# Patient Record
Sex: Male | Born: 1970 | Hispanic: Yes | Marital: Married | Smoking: Current some day smoker
Health system: Southern US, Community
[De-identification: ages and names within clinical notes are randomized; demographics above are authoritative.]

## PROBLEM LIST (undated history)

## (undated) DIAGNOSIS — K5792 Diverticulitis of intestine, part unspecified, without perforation or abscess without bleeding: Secondary | ICD-10-CM

## (undated) HISTORY — DX: Diverticulitis of intestine, part unspecified, without perforation or abscess without bleeding: K57.92

---

## 2005-03-13 ENCOUNTER — Ambulatory Visit: Payer: Self-pay | Admitting: Family Medicine

## 2006-11-03 IMAGING — CR DG ABDOMEN 3V
1 series · 3 of 3 positions shown · non-contrast
Comparison: none

REASON FOR EXAM: LEFT lower quadrant pain. (TELEPHONE RESULTS TO PHYSICIAN)
COMMENTS:

[Series 1: view not recorded · 0.17mm/px · 3 of 3 slices shown]
[im 1/3]
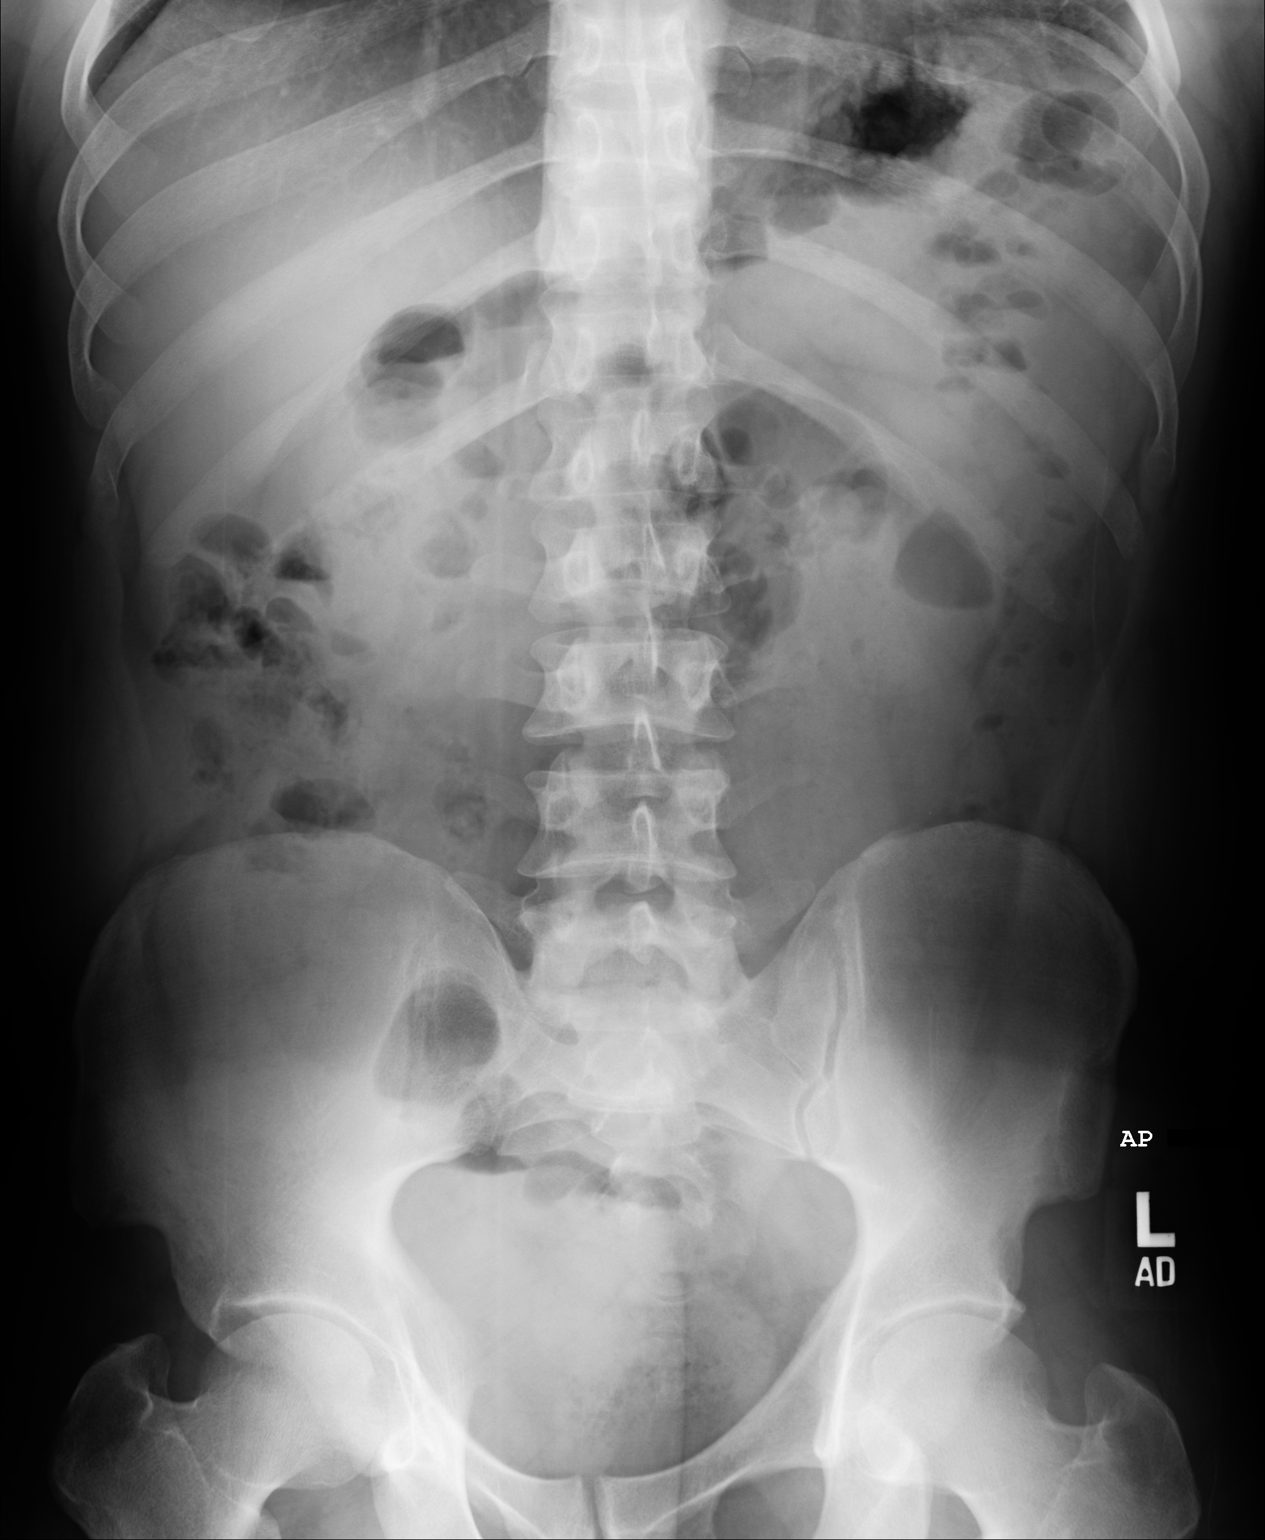
[im 2/3]
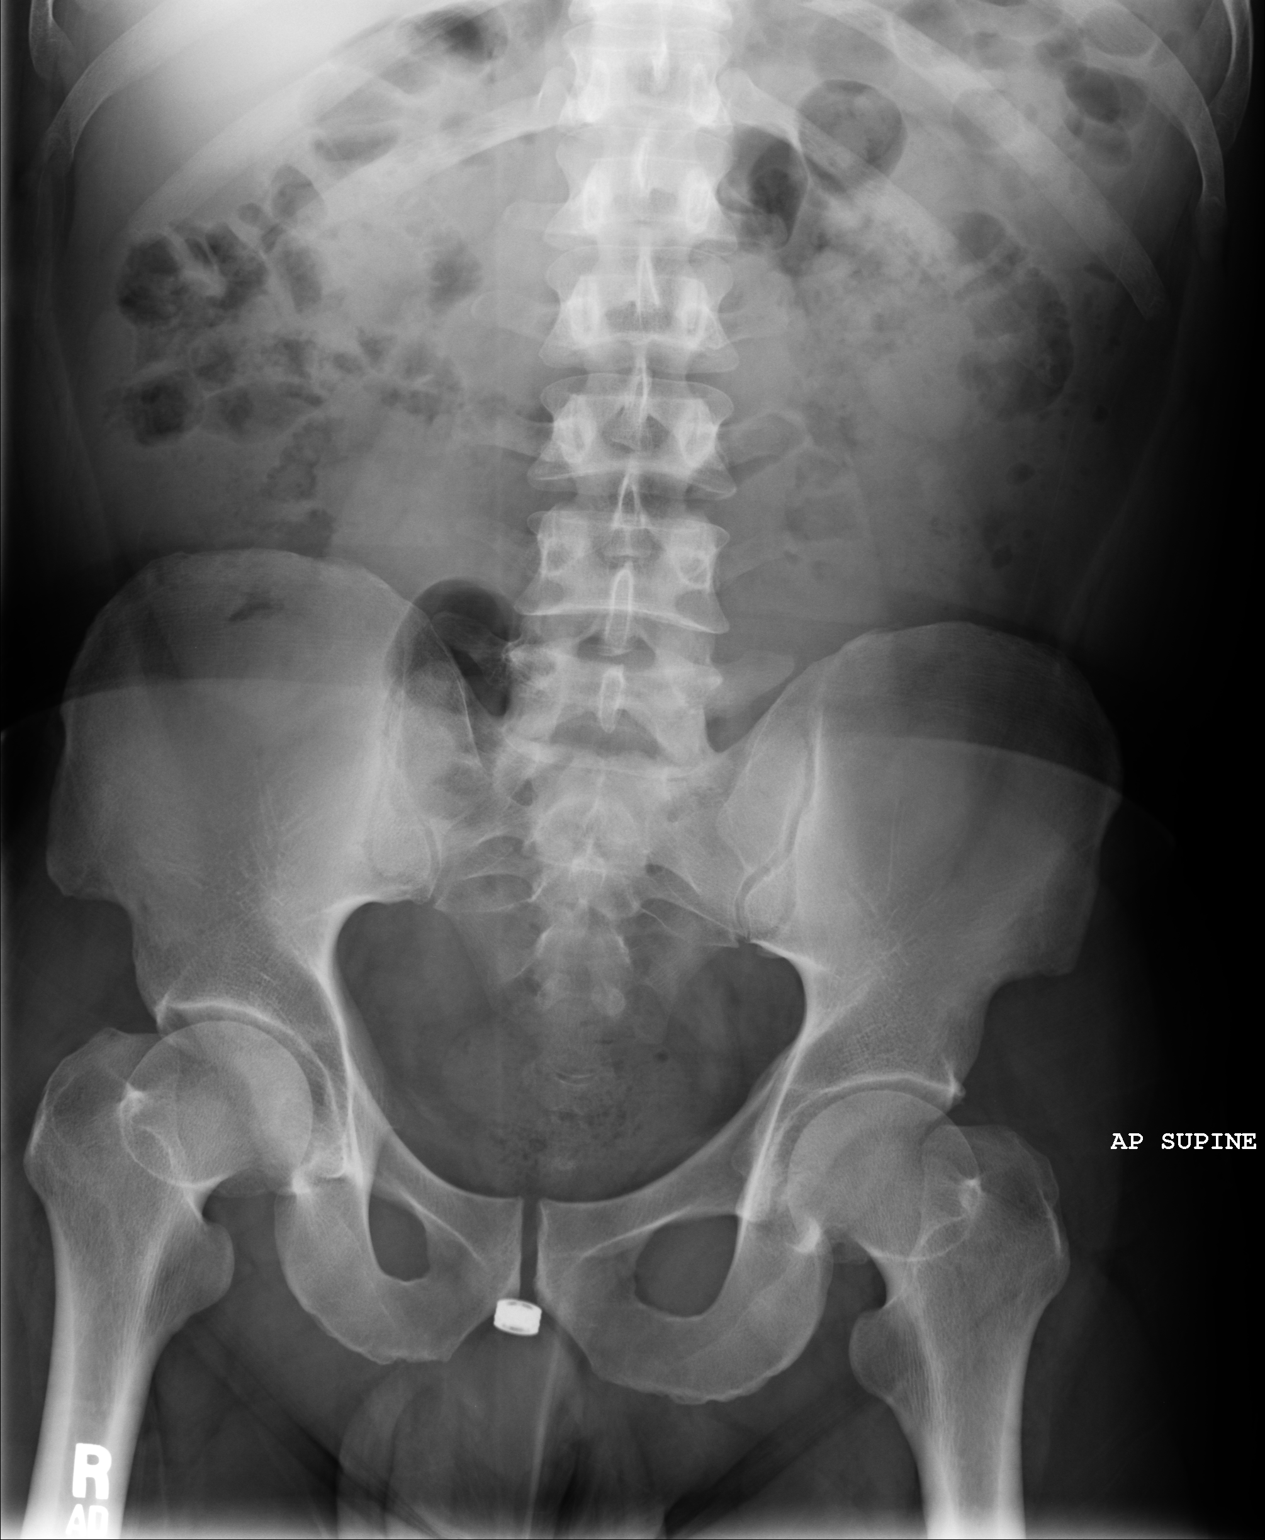
[im 3/3]
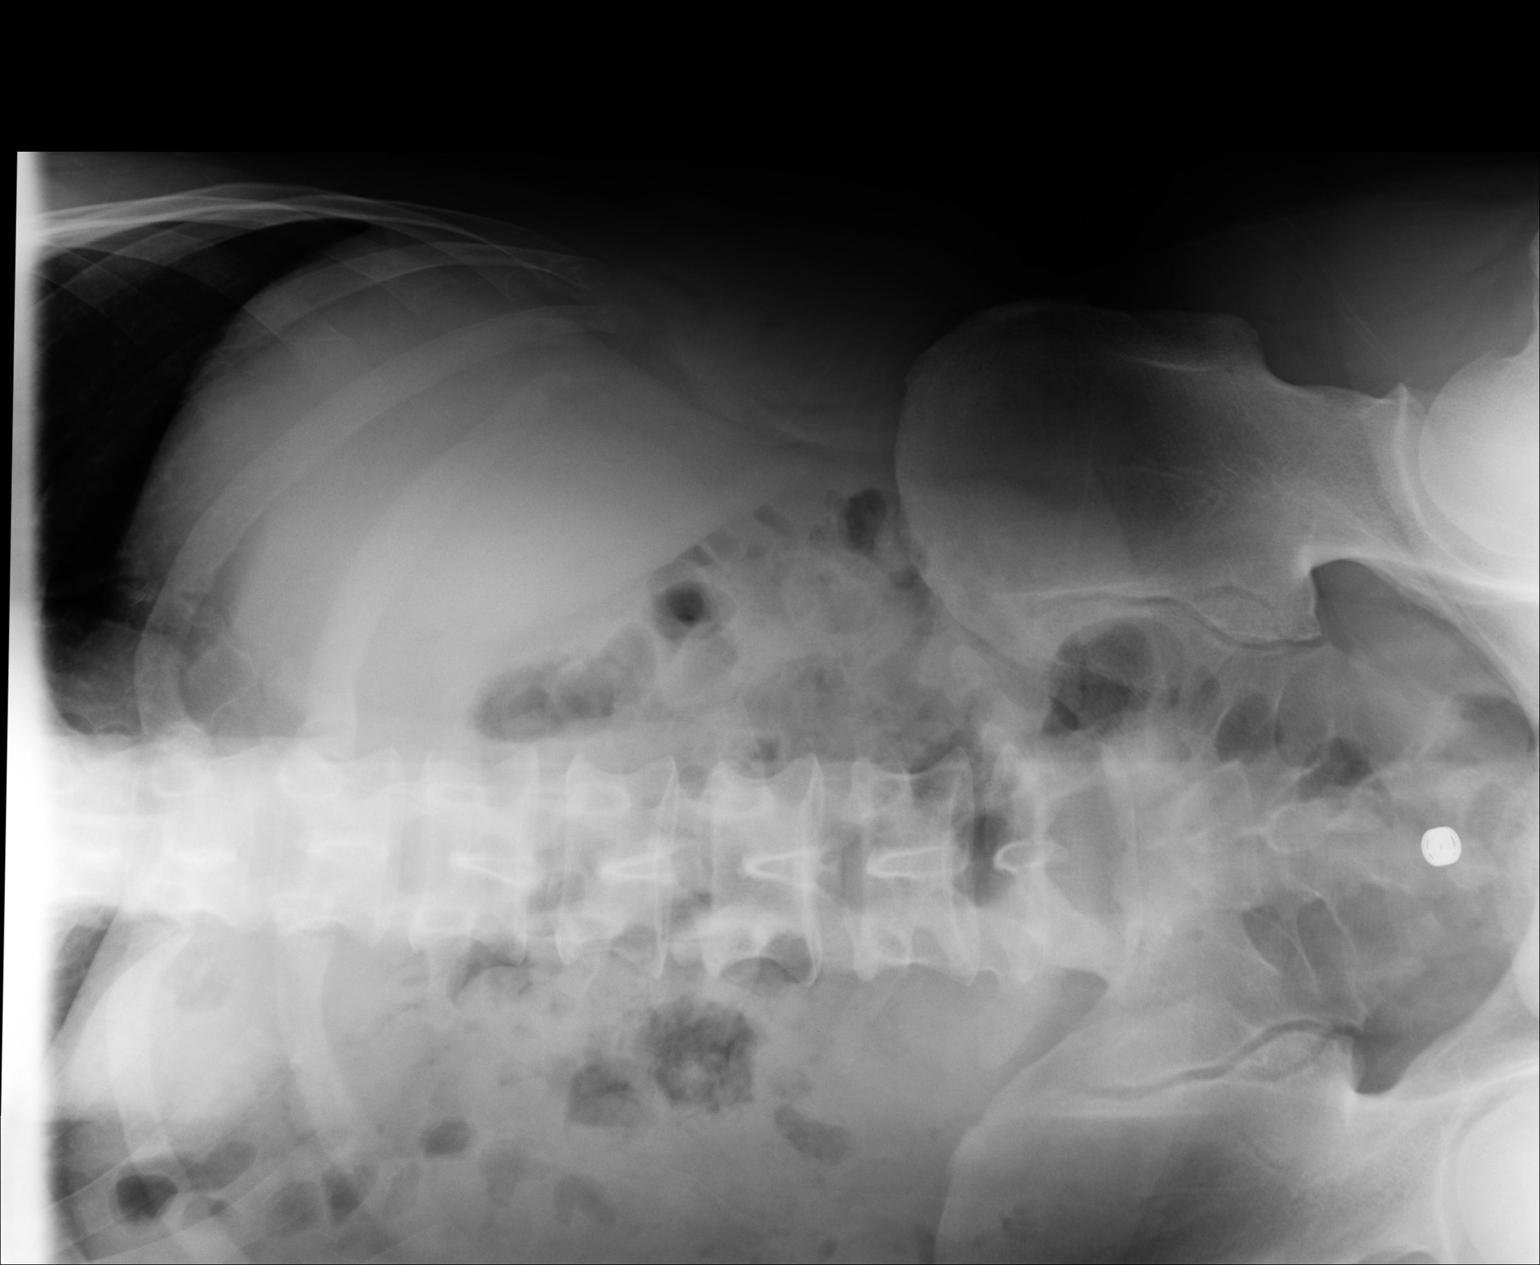

[3 of 3 positions shown; findings below may reference images not displayed]

PROCEDURE:     DXR - DXR ABDOMEN COMPLETE  - March 13, 2005 [DATE]

RESULT:     The patient is complaining of abdominal discomfort.

The bowel gas pattern is relatively nonspecific.  There may be an element of
constipation present.  There is no evidence of obstruction or ileus. The
bony structures are normal in appearance and I see no evidence of soft
tissue calcification.
IMPRESSION: I see no acute intraabdominal abnormality.  Further evaluation with CT
scanning may be of value.  The bowel gas pattern may indicate constipation.

## 2022-10-24 ENCOUNTER — Emergency Department: Payer: Self-pay

## 2022-10-24 ENCOUNTER — Other Ambulatory Visit: Payer: Self-pay

## 2022-10-24 ENCOUNTER — Emergency Department
Admission: EM | Admit: 2022-10-24 | Discharge: 2022-10-25 | Disposition: A | Discharge status: Home / Self Care | Payer: Self-pay | Attending: Emergency Medicine | Admitting: Emergency Medicine

## 2022-10-24 DIAGNOSIS — K5792 Diverticulitis of intestine, part unspecified, without perforation or abscess without bleeding: Secondary | ICD-10-CM

## 2022-10-24 DIAGNOSIS — R109 Unspecified abdominal pain: Secondary | ICD-10-CM

## 2022-10-24 DIAGNOSIS — K5732 Diverticulitis of large intestine without perforation or abscess without bleeding: Secondary | ICD-10-CM | POA: Insufficient documentation

## 2022-10-24 LAB — COMPREHENSIVE METABOLIC PANEL
ALT: 29 U/L (ref 0–44)
AST: 21 U/L (ref 15–41)
Albumin: 4.5 g/dL (ref 3.5–5.0)
Alkaline Phosphatase: 96 U/L (ref 38–126)
Anion gap: 10 (ref 5–15)
BUN: 8 mg/dL (ref 6–20)
CO2: 24 mmol/L (ref 22–32)
Calcium: 8.6 mg/dL — ABNORMAL LOW (ref 8.9–10.3)
Chloride: 106 mmol/L (ref 98–111)
Creatinine, Ser: 0.7 mg/dL (ref 0.61–1.24)
GFR, Estimated: >60 mL/min (ref >60)
Glucose, Bld: 122 mg/dL — ABNORMAL HIGH (ref 70–99)
Potassium: 3.6 mmol/L (ref 3.5–5.1)
Sodium: 140 mmol/L (ref 135–145)
Total Bilirubin: 0.6 mg/dL (ref 0.3–1.2)
Total Protein: 8 g/dL (ref 6.5–8.1)

## 2022-10-24 LAB — CBC
HCT: 41.4 % (ref 39.0–52.0)
Hemoglobin: 13.9 g/dL (ref 13.0–17.0)
MCH: 30.9 pg (ref 26.0–34.0)
MCHC: 33.6 g/dL (ref 30.0–36.0)
MCV: 92 fL (ref 80.0–100.0)
Platelets: 349 10*3/uL (ref 150–400)
RBC: 4.5 MIL/uL (ref 4.22–5.81)
RDW: 12.9 % (ref 11.5–15.5)
WBC: 8.5 10*3/uL (ref 4.0–10.5)
nRBC: 0 % (ref 0.0–0.2)

## 2022-10-24 LAB — TYPE AND SCREEN
ABO/RH(D): O POS
Antibody Screen: NEGATIVE

## 2022-10-24 MED ORDER — IOHEXOL 300 MG/ML  SOLN
100.0000 mL | Freq: Once | INTRAMUSCULAR | Status: AC | PRN
  Administered 2022-10-24: 100 mL via INTRAVENOUS

## 2022-10-24 MED ORDER — AMOXICILLIN-POT CLAVULANATE 875-125 MG PO TABS: 1.0000 | ORAL_TABLET | Freq: Two times a day (BID) | ORAL | 0 refills | Status: AC

## 2022-10-24 NOTE — Discharge Instructions (Signed)
Please seek medical attention for any high fevers, chest pain, shortness of breath, change in behavior, persistent vomiting, bloody stool or any other new or concerning symptoms.  

## 2022-10-24 NOTE — ED Triage Notes (Signed)
Pt arrives via POV w/ c/o LLQ abd pain and blood in stool. Pt reports blood in stool is chronic, today it was "stronger".

## 2022-10-24 NOTE — ED Provider Notes (Signed)
[facility] Provider Note    Event Date/Time   First MD Initiated Contact with Patient 10/24/22 1806     (approximate)   History   Blood In Stools and Abdominal Pain   HPI  Michael Mann is a 52 y.o. male who presents to the emergency department today because of concerns for abdominal pain and blood in stools.  He states he has been having issues with this abdominal pain and bloody stools for at least 1 year.  Today however the bleeding and the pain seemed to be somewhat worse.  Patient felt like he did have slight fevers a couple of days ago.  Also has had some chills.     Physical Exam   Triage Vital Signs: ED Triage Vitals  Encounter Vitals Group     BP 10/24/22 1647 (!) 133/97     Systolic BP Percentile --      Diastolic BP Percentile --      Pulse Rate 10/24/22 1647 98     Resp 10/24/22 1647 15     Temp 10/24/22 1647 (!) 97.5 F (36.4 C)     Temp Source 10/24/22 1647 Oral     SpO2 10/24/22 1647 98 %     Weight 10/24/22 1650 150 lb (68 kg)     Height 10/24/22 1650 4' 11.06" (1.5 m)     Head Circumference --      Peak Flow --      Pain Score 10/24/22 1651 10     Pain Loc --      Pain Education --      Exclude from Growth Chart --     Most recent vital signs: Vitals:   10/24/22 1647  BP: (!) 133/97  Pulse: 98  Resp: 15  Temp: (!) 97.5 F (36.4 C)  SpO2: 98%   General: Awake, alert, oriented. CV:  Good peripheral perfusion. Regular rate and rhythm. Resp:  Normal effort. Lungs clear. Abd:  No distention. Slight tenderness to palpation in the LLQ    ED Results / Procedures / Treatments   Labs (all labs ordered are listed, but only abnormal results are displayed) Labs Reviewed  COMPREHENSIVE METABOLIC PANEL - Abnormal; Notable for the following components:      Result Value   Glucose, Bld 122 (*)    Calcium 8.6 (*)    All other components within normal limits  CBC  POC OCCULT BLOOD, ED  TYPE AND SCREEN      EKG  None   RADIOLOGY I independently interpreted and visualized the CT abd/pel. My interpretation: inflammation in the left lower quadrant Radiology interpretation:  IMPRESSION:  1. Focal adhesion of the sigmoid colon to the left bladder dome and  left anterior abdominal wall likely sequela of chronic  inflammation/diverticulitis. A fistulous tract extends from the  sigmoid colon to the left transverse abdominus muscle. No drainable  fluid collection or abscess. Follow-up with colonoscopy after  resolution of acute symptoms recommended to exclude underlying mass.  2. No bowel obstruction. Normal appendix.     PROCEDURES:  Critical Care performed: No    MEDICATIONS ORDERED IN ED: Medications - No data to display   IMPRESSION / MDM / ASSESSMENT AND PLAN / ED COURSE  I reviewed the triage vital signs and the nursing notes.                              Differential diagnosis  includes, but is not limited to, diverticulitis, neoplasm, AVM, hemorrhoid  Patient's presentation is most consistent with acute presentation with potential threat to life or bodily function.   The patient is on the cardiac monitor to evaluate for evidence of arrhythmia and/or significant heart rate changes.  Patient presented to the emergency department today because of concerns for left lower quadrant abdominal pain as well as rectal bleeding.  Symptoms have been on and off for over a year.  On exam patient is tender left lower quadrant.  I did obtain a CT scan which was concerning for adhesion of the colon to the left bladder dome and left abdominal wall.  Discussed with Dr. Aleen Campi who reviewed the images.  Recommended starting antibiotics and have patient follow-up in clinic.  I discussed findings and plan with the patient.      FINAL CLINICAL IMPRESSION(S) / ED DIAGNOSES   Final diagnoses:  Abdominal pain, unspecified abdominal location  Diverticulitis      Note:  This document  was prepared using Dragon voice recognition software and may include unintentional dictation errors.    Phineas Semen, MD 10/25/22 [phone]

## 2022-11-04 ENCOUNTER — Ambulatory Visit: Payer: Self-pay | Admitting: Surgery

## 2022-11-15 ENCOUNTER — Encounter: Payer: Self-pay | Admitting: Surgery

## 2022-11-15 ENCOUNTER — Ambulatory Visit (INDEPENDENT_AMBULATORY_CARE_PROVIDER_SITE_OTHER): Payer: Self-pay | Admitting: Surgery

## 2022-11-15 VITALS — BP 104/68 | HR 73 | Temp 98.3°F | Ht 59.06 in | Wt 136.2 lb

## 2022-11-15 DIAGNOSIS — K5732 Diverticulitis of large intestine without perforation or abscess without bleeding: Secondary | ICD-10-CM

## 2022-11-15 DIAGNOSIS — Z1211 Encounter for screening for malignant neoplasm of colon: Secondary | ICD-10-CM

## 2022-11-15 MED ORDER — AMOXICILLIN-POT CLAVULANATE 875-125 MG PO TABS: 1.0000 | ORAL_TABLET | Freq: Two times a day (BID) | ORAL | 0 refills | Status: AC

## 2022-11-15 NOTE — Progress Notes (Signed)
11/15/2022  Reason for Visit:  Diverticulitis  History of Present Illness: Michael Mann is a 52 y.o. male presenting for evaluation of diverticulitis.  The patient presented to the ED on 10/24/22 with few days of left lower quadrant pain associated with blood in his stools.  He reports that this has happened in the past before and has had issues with intermittent LLQ pain for the past 9 years.  In the ED, he had a normal Hgb of 13.9, normal WBC of 8.5.  He had a CT scan of abdomen and pelvis which showed sigmoid diverticulitis with are of possible abscess vs inflammatory change that was extending to the anterior abdominal wall and also towards the left corner of the bladder.  He was discharged with a 1 week course of Augmentin.    Patient reports that after discharge, he was feeling better while taking the antibiotic.  More recently he's developed some more pain again.  He also reports having fevers last week, but denies any worsening issues recently.  Denies any nausea or vomiting and he's having bowel movements, recently without blood.  Sometimes he has to strain more for a bowel movement and feels some discomfort in the LLQ before that.  No troubles with urination.  He does report weight loss throughout this year, about 15 lbs.  There's no family history of colon cancer.  Past Medical History: History reviewed. No pertinent past medical history.   Past Surgical History: History reviewed. No pertinent surgical history.  Home Medications: Prior to Admission medications   Medication Sig Start Date End Date Taking? Authorizing Provider  amoxicillin-clavulanate (AUGMENTIN) 875-125 MG tablet Take 1 tablet by mouth 2 (two) times daily for 14 days. 11/15/22 11/29/22 Yes Angelisse Riso, Elita Quick, MD    Allergies: No Known Allergies  Social History:  reports that he has been smoking cigarettes. He has never used smokeless tobacco. He reports current alcohol use. He reports that he does not use drugs.    Family History: History reviewed. No pertinent family history.  Review of Systems: Review of Systems  Constitutional:  Positive for fever and weight loss. Negative for chills.  HENT:  Negative for hearing loss.   Respiratory:  Negative for shortness of breath.   Cardiovascular:  Negative for chest pain.  Gastrointestinal:  Positive for abdominal pain, blood in stool and constipation. Negative for nausea and vomiting.  Genitourinary:  Negative for dysuria.  Musculoskeletal:  Negative for myalgias.  Skin:  Negative for rash.  Neurological:  Negative for dizziness.  Psychiatric/Behavioral:  Negative for depression.     Physical Exam BP 104/68   Pulse 73   Temp 98.3 F (36.8 C) (Oral)   Ht 4' 11.06" (1.5 m)   Wt 136 lb 3.2 oz (61.8 kg)   SpO2 96%   BMI 27.46 kg/m  CONSTITUTIONAL: No acute distress, well nourished. HEENT:  Normocephalic, atraumatic, extraocular motion intact. NECK: Trachea is midline, and there is no jugular venous distension.  RESPIRATORY:  Lungs are clear, and breath sounds are equal bilaterally. Normal respiratory effort without pathologic use of accessory muscles. CARDIOVASCULAR: Heart is regular without murmurs, gallops, or rubs. GI: The abdomen is soft, non-distended, with mild discomfort to palpation in left lower quadrant.  There is a palpable firmness in the deep aspect of the abdominal wall where the sigmoid colon is abutting it as noted on the CT scan.  No external changes to the skin or subcutaneous tissue.   MUSCULOSKELETAL:  Normal muscle strength and tone in all  four extremities.  No peripheral edema or cyanosis. SKIN: Skin turgor is normal. There are no pathologic skin lesions.  NEUROLOGIC:  Motor and sensation is grossly normal.  Cranial nerves are grossly intact. PSYCH:  Alert and oriented to person, place and time. Affect is normal.  Laboratory Analysis: Labs from 10/24/22: Na 140, K 3.6, Cl 106, CO2 24, BUN 8, Cr 0.70.  Total bili 0.6, AST 21,  ALT 29, alkaline phosphatase 96, albumin 4.5.  WBC 8.5, hemoglobin 13.9, hematocrit 41.4, platelets 349.  Imaging: CT abdomen/pelvis on 10/24/2022: IMPRESSION: 1. Focal adhesion of the sigmoid colon to the left bladder dome and left anterior abdominal wall likely sequela of chronic inflammation/diverticulitis. A fistulous tract extends from the sigmoid colon to the left transverse abdominus muscle. No drainable fluid collection or abscess. Follow-up with colonoscopy after resolution of acute symptoms recommended to exclude underlying mass. 2. No bowel obstruction. Normal appendix.  Assessment and Plan: This is a 52 y.o. male with chronic history of diverticulitis.  - The patient reports a chronic history of about 9 years of intermittent left lower quadrant pain.  Based on CT scan findings, this is most likely to be episodes of diverticulitis which over the course of time have developed inflammatory changes towards the abdominal wall and potentially the bladder.  There is no external changes in the abdominal wall to suggest an abscess and the patient denies any urinary symptoms to suggest a fistula to the bladder.  The patient reports that initially after treatment from the emergency room, he was feeling better but now there is some pain has been increasing again.  As a precaution, I will give the patient a longer course of Augmentin for 2 weeks total.  I will then have the patient get a CT scan again of the abdomen and pelvis with contrast in 3 weeks to reevaluate the area of inflammation. - Discussed with the patient that although this may be diverticulitis, we need to rule out any underlying masses.  We will set up a referral for gastroenterology for colonoscopy. - Patient will follow-up with me in a month to reevaluate his pain and discussed results from his CT scan.  Return precautions given.  All of his questions have been answered.  I spent 45 minutes dedicated to the care of this patient on  the date of this encounter to include pre-visit review of records, face-to-face time with the patient discussing diagnosis and management, and any post-visit coordination of care.   Howie Ill, MD [facility]

## 2022-11-15 NOTE — Patient Instructions (Addendum)
Su tomografa computarizada est programada para el 13/11/2022 a las 10:30 a. m. (llegue a las 8:30 a. m. para Air cabin crew) en [employer]. Nada de comer ni beber 4 horas antes.  Se ha realizado una remisin a [facility]. Te llamarn para concertar una cita.  Su tomografa computarizada est programada para el 13/11/2022 a las 10:30 a. m. (llegue a las 8:30 a. m. para Air cabin crew) en [employer]. Nada de comer ni beber 4 horas antes.  Diverticulitis Diverticulitis  La diverticulitis ocurre cuando pequeas bolsas en el colon se infectan o se inflaman. Esto ocasiona dolor en el vientre (abdomen) y heces acuosas (diarrea). Las pequeas bolsas se denominan divertculos. Se pueden formar en las personas que tienen una afeccin llamada diverticulosis. Cules son las causas? Puede desarrollar esta afeccin si queda materia fecal (heces) atrapada en las bolsas del colon. La materia fecal permite el crecimiento de grmenes (bacterias). Esto causa una infeccin. Qu incrementa el riesgo? Es ms probable que contraiga esta afeccin si tiene pequeas bolsas en el colon. Tambin es ms probable que la contraiga si: Tiene sobrepeso o mucho sobrepeso (es obeso). No realiza actividad fsica suficiente. Bebe alcohol. Fuma. Come mucha carne roja, como carne de [place], cerdo o cordero. No consume suficiente fibra. Es mayor de 40 aos. Cules son los signos o sntomas? Dolor en el vientre. El dolor suele aparecer en el lado izquierdo, pero puede manifestarse tambin en otras zonas. Grant Ruts y escalofros. Ganas de vomitar. Vmitos. Clicos. Sensacin de estar lleno. Cambios en la frecuencia de las deposiciones. Sangre en las heces. Cmo se trata? La mayora de los casos se tratan en casa. Es posible que le indiquen lo siguiente: Tomar analgsicos de Sales promotion account executive. Beber solamente lquidos claros. Tomar antibiticos. Descanso. Es posible que los casos muy graves deban tratarse en el  hospital. El tratamiento puede incluir: No comer ni beber nada. Tomar analgsicos. Recibir antibiticos por va intravenosa. Recibir lquidos y alimentos a travs de una va intravenosa. Someterse a [place]. Cuando se sienta mejor, el mdico puede indicarle que se someta a una prueba para examinar el colon (colonoscopa). Siga estas instrucciones en su casa: Medicamentos Use los medicamentos de venta libre y los recetados solamente como se lo haya indicado el mdico. Esto incluye lo siguiente: Pastillas de [place]. Probiticos. Medicamentos para ablandar las heces (laxantes). Si le recetaron antibiticos, tmelos como se lo haya indicado el mdico. No deje de tomarlos aunque comience a sentirse mejor. Pregunte al mdico si debe evitar conducir o Chemical engineer mquinas mientras toma los medicamentos. Comida y bebida  Siga la dieta como se lo haya indicado el mdico. Es posible que solo deba comer y beber lquidos. Cuando se sienta mejor, es posible que pueda comer ms alimentos. Quiz tambin le indiquen que coma mucha fibra. La fibra le ayuda a Advertising copywriter. Los alimentos con fibra incluyen bayas, frijoles, lentejas y verduras verdes. Trate de no comer carne roja. Instrucciones generales No fume ni consuma ningn producto que contenga nicotina o tabaco. Si necesita ayuda para dejar de fumar, consulte al mdico. Haga ejercicio 3 o ms veces por semana. Trate de hacer 30 minutos cada vez. Ejerctese lo suficiente como para transpirar y Environmental education officer los latidos cardacos. Comunquese con un mdico si: El Product/process development scientist. No defeca como lo hace normalmente. Los sntomas no mejoran. Los sntomas empeoran con gran rapidez. Tiene fiebre. Vomita ms de una vez. Sus heces tienen las siguientes caractersticas: Paramedic. Son de color negro. Son alquitranadas. Esta informacin no  tiene Theme park manager el consejo del mdico. Asegrese de hacerle al mdico cualquier pregunta que tenga. Document  Revised: 01/10/2022 Document Reviewed: 01/10/2022 Elsevier Patient Education  2024 [employer].

## 2022-11-27 ENCOUNTER — Encounter: Payer: Self-pay | Admitting: *Deleted

## 2022-12-04 ENCOUNTER — Telehealth: Payer: Self-pay | Admitting: Gastroenterology

## 2022-12-04 NOTE — Telephone Encounter (Signed)
The patient and his daughter called in to schedule his colonoscopy. He said we can speak with his daughter Lenard Simmer.

## 2022-12-05 NOTE — Telephone Encounter (Signed)
When I spoke with patient's daughter about scheduling. He was not there for me to confirm the verbal consent.  She will have her sister Fausto Skillern) who is on the DPR to give me a call to schedule the colonoscopy.

## 2022-12-06 ENCOUNTER — Ambulatory Visit
Admission: RE | Admit: 2022-12-06 | Discharge: 2022-12-06 | Disposition: A | Discharge status: Home / Self Care | Payer: Self-pay | Source: Ambulatory Visit | Attending: Surgery | Admitting: Surgery

## 2022-12-06 ENCOUNTER — Telehealth: Payer: Self-pay | Admitting: *Deleted

## 2022-12-06 ENCOUNTER — Other Ambulatory Visit: Payer: Self-pay | Admitting: *Deleted

## 2022-12-06 DIAGNOSIS — Z1211 Encounter for screening for malignant neoplasm of colon: Secondary | ICD-10-CM

## 2022-12-06 DIAGNOSIS — K5732 Diverticulitis of large intestine without perforation or abscess without bleeding: Secondary | ICD-10-CM | POA: Insufficient documentation

## 2022-12-06 MED ORDER — IOHEXOL 300 MG/ML  SOLN
100.0000 mL | Freq: Once | INTRAMUSCULAR | Status: AC | PRN
  Administered 2022-12-06: 100 mL via INTRAVENOUS

## 2022-12-06 MED ORDER — PEG 3350-KCL-NABCB-NACL-NASULF 236 G PO SOLR: 4000.0000 mL | Freq: Once | ORAL | 0 refills | Status: AC

## 2022-12-06 NOTE — Telephone Encounter (Signed)
Gastroenterology Pre-Procedure Review  Request Date: 01/24/2023 Requesting Physician: Dr. Tobi Bastos  PATIENT REVIEW QUESTIONS: The patient responded to the following health history questions as indicated:    1. Are you having any GI issues? Diverticulitis 2. Do you have a personal history of Polyps? no 3. Do you have a family history of Colon Cancer or Polyps? no 4. Diabetes Mellitus? no 5. Joint replacements in the past 12 months?no 6. Major health problems in the past 3 months?no 7. Any artificial heart valves, MVP, or defibrillator?no    MEDICATIONS & ALLERGIES:    Patient reports the following regarding taking any anticoagulation/antiplatelet therapy:   Plavix, Coumadin, Eliquis, Xarelto, Lovenox, Pradaxa, Brilinta, or Effient? no Aspirin? no  Patient confirms/reports the following medications:  No current outpatient medications on file.   No current facility-administered medications for this visit.    Patient confirms/reports the following allergies:  No Known Allergies  No orders of the defined types were placed in this encounter.   AUTHORIZATION INFORMATION Primary Insurance: 1D#: Group #:  Secondary Insurance: 1D#: Group #:  SCHEDULE INFORMATION: Date: 01/24/2023 Time: Location:  [facility]

## 2022-12-06 NOTE — Telephone Encounter (Signed)
Patient oldest daughter called in to schedule her dad colonoscopy.

## 2022-12-06 NOTE — Telephone Encounter (Addendum)
Colonoscopy schedule on 01/24/2023 wit Dr Tobi Bastos

## 2022-12-13 ENCOUNTER — Encounter: Payer: Self-pay | Admitting: Surgery

## 2022-12-13 ENCOUNTER — Ambulatory Visit (INDEPENDENT_AMBULATORY_CARE_PROVIDER_SITE_OTHER): Payer: Self-pay | Admitting: Surgery

## 2022-12-13 VITALS — BP 101/69 | HR 86 | Temp 99.0°F | Ht 59.0 in | Wt 137.8 lb

## 2022-12-13 DIAGNOSIS — K5732 Diverticulitis of large intestine without perforation or abscess without bleeding: Secondary | ICD-10-CM

## 2022-12-13 NOTE — Patient Instructions (Signed)
Diverticulitis Diverticulitis  La diverticulitis ocurre cuando pequeas bolsas en el colon se infectan o se inflaman. Esto ocasiona dolor en el vientre (abdomen) y heces acuosas (diarrea). Las pequeas bolsas se denominan divertculos. Se pueden formar en las personas que tienen una afeccin llamada diverticulosis. Cules son las causas? Puede desarrollar esta afeccin si queda materia fecal (heces) atrapada en las bolsas del colon. La materia fecal permite el crecimiento de grmenes (bacterias). Esto causa una infeccin. Qu incrementa el riesgo? Es ms probable que contraiga esta afeccin si tiene pequeas bolsas en el colon. Tambin es ms probable que la contraiga si: Tiene sobrepeso o mucho sobrepeso (es obeso). No realiza actividad fsica suficiente. Bebe alcohol. Fuma. Come mucha carne roja, como carne de [place], cerdo o cordero. No consume suficiente fibra. Es mayor de 40 aos. Cules son los signos o sntomas? Dolor en el vientre. El dolor suele aparecer en el lado izquierdo, pero puede manifestarse tambin en otras zonas. Grant Ruts y escalofros. Ganas de vomitar. Vmitos. Clicos. Sensacin de estar lleno. Cambios en la frecuencia de las deposiciones. Sangre en las heces. Cmo se trata? La mayora de los casos se tratan en casa. Es posible que le indiquen lo siguiente: Tomar analgsicos de Sales promotion account executive. Beber solamente lquidos claros. Tomar antibiticos. Descanso. Es posible que los casos muy graves deban tratarse en el hospital. El tratamiento puede incluir: No comer ni beber nada. Tomar analgsicos. Recibir antibiticos por va intravenosa. Recibir lquidos y alimentos a travs de una va intravenosa. Someterse a [place]. Cuando se sienta mejor, el mdico puede indicarle que se someta a una prueba para examinar el colon (colonoscopa). Siga estas instrucciones en su casa: Medicamentos Use los medicamentos de venta libre y los recetados solamente como se lo haya  indicado el mdico. Esto incluye lo siguiente: Pastillas de [place]. Probiticos. Medicamentos para ablandar las heces (laxantes). Si le recetaron antibiticos, tmelos como se lo haya indicado el mdico. No deje de tomarlos aunque comience a sentirse mejor. Pregunte al mdico si debe evitar conducir o Chemical engineer mquinas mientras toma los medicamentos. Comida y bebida  Siga la dieta como se lo haya indicado el mdico. Es posible que solo deba comer y beber lquidos. Cuando se sienta mejor, es posible que pueda comer ms alimentos. Quiz tambin le indiquen que coma mucha fibra. La fibra le ayuda a Advertising copywriter. Los alimentos con fibra incluyen bayas, frijoles, lentejas y verduras verdes. Trate de no comer carne roja. Instrucciones generales No fume ni consuma ningn producto que contenga nicotina o tabaco. Si necesita ayuda para dejar de fumar, consulte al mdico. Haga ejercicio 3 o ms veces por semana. Trate de hacer 30 minutos cada vez. Ejerctese lo suficiente como para transpirar y Environmental education officer los latidos cardacos. Comunquese con un mdico si: El Product/process development scientist. No defeca como lo hace normalmente. Los sntomas no mejoran. Los sntomas empeoran con gran rapidez. Tiene fiebre. Vomita ms de una vez. Sus heces tienen las siguientes caractersticas: Paramedic. Son de color negro. Son alquitranadas. Esta informacin no tiene Theme park manager el consejo del mdico. Asegrese de hacerle al mdico cualquier pregunta que tenga. Document Revised: 01/10/2022 Document Reviewed: 01/10/2022 Elsevier Patient Education  2024 [employer].

## 2022-12-13 NOTE — Progress Notes (Signed)
12/13/2022  History of Present Illness: Michael Mann is a 52 y.o. male presenting for follow up of diverticulitis.  The patient was last seen on 11/15/22 at which time he mentioned that his pain was worsening again after completing 7 days of Augmentin.  As a precaution, I ordered a 2 week course of Augmentin and he also had a follow up CT scan last week to check on the diverticulitis.  Overall the area of thickness is similar, without abscess or perforation.  The colon is still tethered to the anterior abdominal wall and also to a corner of the bladder wall.  No evidence of fistula or abscess.  The patient reports that he's feeling better.  It's easier to have bowel movements.  He still feels the firmness in the LLQ abdominal wall and sometimes that causes slight burning sensation, but goes away quickly.  Denies any fevers, chills, chest pain, shortness of breath.  Past Medical History: History reviewed. No pertinent past medical history.   Past Surgical History: History reviewed. No pertinent surgical history.  Home Medications: Prior to Admission medications   Not on File    Allergies: No Known Allergies  Review of Systems: Review of Systems  Constitutional:  Negative for chills and fever.  Respiratory:  Negative for shortness of breath.   Cardiovascular:  Negative for chest pain.  Gastrointestinal:  Positive for abdominal pain (LLQ abdominal wall). Negative for constipation.  Genitourinary:  Negative for dysuria.    Physical Exam BP 101/69 (BP Location: Left Arm, Patient Position: Sitting, Cuff Size: Small)   Pulse 86   Temp 99 F (37.2 C) (Oral)   Ht 4\' 11"  (1.499 m)   Wt 137 lb 12.8 oz (62.5 kg)   SpO2 96%   BMI 27.83 kg/m  CONSTITUTIONAL: No acute distress, well nourished. HEENT:  Normocephalic, atraumatic, extraocular motion intact. RESPIRATORY:  Normal respiratory effort without pathologic use of accessory muscles. CARDIOVASCULAR: Regular rhythm and rate GI: The  abdomen is soft, non-distended, currently non-tender to palpation.  There is a palpable area of firmness in the anterior abdominal wall at the left lower quadrant consistent with some inflammatory changes likely from the colon tethered to the abdominal wall.  No peritonitis. NEUROLOGIC:  Motor and sensation is grossly normal.  Cranial nerves are grossly intact. PSYCH:  Alert and oriented to person, place and time. Affect is normal.  Labs/Imaging: CT abdomen/pelvis on 12/06/22: IMPRESSION: 1. Diverticulosis of the sigmoid with mild Peridiverticular fat stranding consistent with diverticulitis. No abscess or obstruction. 2. Mucosal thickening of the sigmoid. To ensure resolution of this finding and exclude underlying mucosal lesions, consider endoscopic correlation after therapy for diverticulitis.  Assessment and Plan: This is a 52 y.o. male with diverticulitis with tethering of the colon to the abdominal wall and bladder.  --Discussed with the patient the findings on the CT scan.  Overall there is still some inflammatory changes but there is no abscess or any worsening.  No evidence of fistula at this point.  The patient reports that he's feeling better after the antibiotic course, with only some intermittent discomfort/burning sensation in the LLQ wall.  For now, does not need further antibiotics. --He has a colonoscopy scheduled with Dr. Tobi Bastos on 01/24/23.  Will discuss with him if that date needs to be changed given recent CT scan findings, or if a repeat CT scan is needed prior to colonoscopy.  Based on that, he will follow up with me after colonoscopy is done so we can discuss surgery  in the future, likely between December/January. --Patient understands this plan and all of his questions have been answered.  I spent 20 minutes dedicated to the care of this patient on the date of this encounter to include pre-visit review of records, face-to-face time with the patient discussing diagnosis and  management, and any post-visit coordination of care.   Howie Ill, MD [facility]

## 2022-12-24 ENCOUNTER — Telehealth: Payer: Self-pay

## 2022-12-24 NOTE — Telephone Encounter (Signed)
Called patient but had to leave him a voicemail message letting him know that we had to cancel his colonoscopy. Dr. Aleen Campi requested for patient to schedule an appointment with Dr. Tobi Bastos first by the end of the month. Once he is seen, we will need to schedule him a colonoscopy.  By then (appointment date) Dr. Tobi Bastos will decide if patient needs a CT Scan abdomen.  Potential appointment: 01/14/2023 at 2:15 PM

## 2022-12-25 NOTE — Telephone Encounter (Signed)
Called patient again and left him a Spanish message to call me back.

## 2023-01-08 NOTE — Telephone Encounter (Signed)
Patient has an appointment with Dr. Tobi Bastos on 01/14/2023.

## 2023-01-14 ENCOUNTER — Ambulatory Visit (INDEPENDENT_AMBULATORY_CARE_PROVIDER_SITE_OTHER): Payer: Self-pay | Admitting: Gastroenterology

## 2023-01-14 ENCOUNTER — Encounter: Payer: Self-pay | Admitting: Gastroenterology

## 2023-01-14 VITALS — BP 105/69 | HR 82 | Temp 97.8°F | Ht 59.0 in | Wt 139.6 lb

## 2023-01-14 DIAGNOSIS — K5732 Diverticulitis of large intestine without perforation or abscess without bleeding: Secondary | ICD-10-CM | POA: Insufficient documentation

## 2023-01-14 DIAGNOSIS — R933 Abnormal findings on diagnostic imaging of other parts of digestive tract: Secondary | ICD-10-CM

## 2023-01-14 DIAGNOSIS — R1032 Left lower quadrant pain: Secondary | ICD-10-CM

## 2023-01-14 MED ORDER — NA SULFATE-K SULFATE-MG SULF 17.5-3.13-1.6 GM/177ML PO SOLN: 354.0000 mL | Freq: Once | ORAL | 0 refills | Status: AC

## 2023-01-14 NOTE — Patient Instructions (Signed)
Por favor e llegar al departamento [facility] a las 5:15 PM el 25 de [place] del 2024. Usted puede comer y tomar antes si necesita. Si necesita cambiar su cita, por favor de llamar al [phone].

## 2023-01-14 NOTE — Progress Notes (Signed)
    Wyline Mood MD, MRCP(U.K) [address]  [address]  [place], [place] [address]  Main: [phone]  Fax: [phone]   Gastroenterology Consultation  Referring Provider:    Dr. Aleen Campi Primary Care Physician:  Pcp, No Primary Gastroenterologist:  Dr. Wyline Mood  Reason for Consultation:   Diverticulitis of colon        HPI:   Michael Mann is a 52 y.o. y/o male referred for diverticulitis of the colon. Entire visit was conducted via an interpreter.  Since his episode of diverticulitis the pain has significantly reduced.  He still has mild left lower quadrant discomfort.  No blood in the stool.  No change in shape of his bowel movements.  No unintentional weight loss.  No rectal bleeding.  No prior colonoscopy.  No family history of colon cancer.  12/06/2022 CT scan of the abdomen still shows mild peridiverticular fat stranding consistent with diverticulitis no abscess or obstruction. 10/24/2022 CT scan of the abdomen showed focal adhesions of the sigmoid colon to the bladder dome and left anterior abdominal wall.  Past Medical History:  Diagnosis Date   Diverticulitis     No past surgical history on file.  Prior to Admission medications   Not on File    No family history on file.   Social History   Tobacco Use   Smoking status: Some Days    Types: Cigarettes   Smokeless tobacco: Never  Substance Use Topics   Alcohol use: Yes   Drug use: Never    Allergies as of 01/14/2023   (No Known Allergies)    Review of Systems:    All systems reviewed and negative except where noted in HPI.   Physical Exam:  BP 105/69   Pulse 82   Temp 97.8 F (36.6 C) (Oral)   Ht 4\' 11"  (1.499 m)   Wt 139 lb 9.6 oz (63.3 kg)   BMI 28.20 kg/m  No LMP for male patient. Psych:  Alert and cooperative. Normal mood and affect. General:   Alert,  Well-developed, well-nourished, pleasant and cooperative in NAD Head:  Normocephalic and atraumatic. Eyes:  Sclera clear, no  icterus.   Conjunctiva pink. Ears:  Normal auditory acuity. Abdomen: Swelling noted in the left lower quadrant over the lateral aspect of the bladder firm in nature mild tenderness no rebound rigidity or guarding Neurologic:  Alert and oriented x3;  grossly normal neurologically. Psych:  Alert and cooperative. Normal mood and affect.  Imaging Studies: No results found.  Assessment and Plan:   Michael Mann is a 52 y.o. y/o male has been referred for recent episode of diverticulitis of the sigmoid colon.  He has had 2 CT scans showing persistence of diverticulitis.  He still has some abdominal discomfort in the left lower quadrant with concern for a palpable firm area in the left lower quadrant.  I am unsure if his diverticulitis has completely resolved since we will need to make sure it is before we perform a colonoscopy as a colonoscopy would be contraindicated with active inflammation.  I will proceed with a CAT scan of the abdomen if active inflammation is not seen then we will proceed with colonoscopy to rule out any neoplasm which has precipitated the diverticulitis.  Follow up in as needed  Dr Wyline Mood MD,MRCP(U.K)

## 2023-01-17 ENCOUNTER — Ambulatory Visit
Admission: RE | Admit: 2023-01-17 | Discharge: 2023-01-17 | Disposition: A | Discharge status: Home / Self Care | Payer: Self-pay | Source: Ambulatory Visit | Attending: Gastroenterology | Admitting: Gastroenterology

## 2023-01-17 DIAGNOSIS — R1032 Left lower quadrant pain: Secondary | ICD-10-CM

## 2023-01-17 MED ORDER — IOHEXOL 300 MG/ML  SOLN
100.0000 mL | Freq: Once | INTRAMUSCULAR | Status: AC | PRN
  Administered 2023-01-17: 100 mL via INTRAVENOUS

## 2023-01-24 ENCOUNTER — Ambulatory Visit: Admit: 2023-01-24 | Payer: Self-pay | Admitting: Gastroenterology

## 2023-01-24 SURGERY — COLONOSCOPY WITH PROPOFOL
Anesthesia: General

## 2023-01-28 NOTE — Progress Notes (Deleted)
Celso Amy, PA-C [address]  [address]  [place], [place] [address]  Main: [phone]  Fax: [phone]   Primary Care Physician: Pcp, No  Primary Gastroenterologist:  ***  CC: Follow-up diverticulitis and LLQ pain  HPI: Michael Mann is a 52 y.o. male returns for 2-week follow-up of sigmoid diverticulitis and LLQ pain.  Patient saw Dr. Tobi Bastos 01/14/2023.  No previous colonoscopy.  No family history of colon cancer.  Patient denies weight loss or rectal bleeding.  We are using Spanish interpreter today.  Colonoscopy has been postponed until resolution of diverticulitis to prevent colon perforation.  01/25/2023 abdominal pelvic CT with contrast: Decreased sigmoid colon wall thickening and pericolonic stranding consistent with resolving diverticulitis.  Question of interloop fistula of the sigmoid colon adhesed to urinary bladder.  Adhesion of sigmoid colon to anterior pelvic wall.  12/06/2022 CT scan of the abdomen still shows mild peridiverticular fat stranding consistent with diverticulitis no abscess or obstruction.  10/24/2022 CT scan of the abdomen showed focal adhesions of the sigmoid colon to the bladder dome and left anterior abdominal wall.  On 10/24/2022 he was treated with Augmentin 875 twice daily x 7 days.  On 11/15/2022 he was treated with Augmentin twice daily for 10 more days.  Drug allergies?  Alcohol?  LLQ pain?   No current outpatient medications on file.   No current facility-administered medications for this visit.    Allergies as of 01/29/2023   (No Known Allergies)    Past Medical History:  Diagnosis Date   Diverticulitis     No past surgical history on file.  Review of Systems:    All systems reviewed and negative except where noted in HPI.   Physical Examination:   There were no vitals taken for this visit.  General: Well-nourished, well-developed in no acute distress.  Lungs: Clear to auscultation bilaterally. Non-labored. Heart:  Regular rate and rhythm, no murmurs rubs or gallops.  Abdomen: Bowel sounds are normal; Abdomen is Soft; No hepatosplenomegaly, masses or hernias;  No Abdominal Tenderness; No guarding or rebound tenderness. Neuro: Alert and oriented x 3.  Grossly intact.  Psych: Alert and cooperative, normal mood and affect.   Imaging Studies: CT ABDOMEN PELVIS W CONTRAST  Result Date: 01/25/2023 CLINICAL DATA:  Left lower quadrant abdominal pain x2 days, blood in stool. EXAM: CT ABDOMEN AND PELVIS WITH CONTRAST TECHNIQUE: Multidetector CT imaging of the abdomen and pelvis was performed using the standard protocol following bolus administration of intravenous contrast. RADIATION DOSE REDUCTION: This exam was performed according to the departmental dose-optimization program which includes automated exposure control, adjustment of the mA and/or kV according to patient size and/or use of iterative reconstruction technique. CONTRAST:  OMNIPAQUE IOHEXOL 300 MG/ML  SOLN COMPARISON:  CT October 24, 2022 and December 04, 2022. FINDINGS: Lower chest: Unchanged subpleural 5 mm left lower lobe pulmonary nodule on image 2/4 Hepatobiliary: No suspicious hepatic lesion. Gallbladder is unremarkable. No biliary ductal dilation. Pancreas: No pancreatic ductal dilation or evidence of acute inflammation. Spleen: No splenomegaly. Adrenals/Urinary Tract: Bilateral adrenal glands appear normal. No hydronephrosis. Kidneys demonstrate symmetric enhancement. Tenting of the left superior margin of the urinary bladder into the stellate focus of soft tissue along the sigmoid colon. No gas in the urinary bladder. Stomach/Bowel: No radiopaque enteric contrast material was administered. Stomach is unremarkable for degree of distension. Appendicoliths without findings of acute appendicitis. Colonic diverticulosis. Decreased sigmoid colonic wall thickening pericolonic stranding. Stellate appearance of soft tissue along the proximal/mid  sigmoid colon  in the area of prior inflammation with tenting of the urinary bladder into this stellate soft tissue. Additional probable adhesion along the left anterior pelvic wall on image 53/2. Vascular/Lymphatic: Normal caliber abdominal aorta. Smooth IVC contours. The portal, splenic and superior mesenteric veins are patent. No pathologically enlarged abdominal or pelvic lymph nodes. Reproductive: Prostate is unremarkable. Other: No walled-off fluid collection. No pneumoperitoneum. No significant abdominopelvic free fluid. Musculoskeletal: Mild multilevel degenerative changes spine. IMPRESSION: 1. Decreased sigmoid colonic wall thickening and pericolonic stranding, compatible with resolved/resolving diverticulitis. 2. Sequela of prior diverticulitis including adhesion of the sigmoid colon with the anterior pelvic wall as well as adhesion of the urinary bladder and sigmoid colon with question of interloop fistula of the sigmoid colon in a stellate area of soft tissue to which the urinary bladder is also adhesed. There is no gas in the urinary bladder to suggest colovesicular fistula however would consider further evaluation with urinalysis. 3. Unchanged subpleural 5 mm left lower lobe pulmonary nodule, likely benign in a low risk patient requiring no additional imaging follow-up. Electronically Signed   By: Maudry Mayhew M.D.   On: 01/25/2023 09:22    Assessment and Plan:   Michael Mann is a 52 y.o. y/o male presents for follow-up of:  1.  Persistent sigmoid diverticulitis with questionable fistula  If still having pain, treat with Cipro and Flagyl x 10 days.  Avoid all alcohol while taking Abx.  Celso Amy, PA-C  Follow up 2 weeks.  BP check ***

## 2023-01-29 ENCOUNTER — Ambulatory Visit: Payer: Self-pay | Admitting: Physician Assistant

## 2023-01-31 ENCOUNTER — Ambulatory Visit: Payer: Self-pay | Admitting: Surgery

## 2023-02-10 ENCOUNTER — Ambulatory Visit (INDEPENDENT_AMBULATORY_CARE_PROVIDER_SITE_OTHER): Payer: Self-pay | Admitting: Surgery

## 2023-02-10 ENCOUNTER — Encounter: Payer: Self-pay | Admitting: Surgery

## 2023-02-10 ENCOUNTER — Ambulatory Visit: Payer: Self-pay | Admitting: Surgery

## 2023-02-10 VITALS — BP 105/69 | HR 93 | Temp 98.1°F | Ht 59.0 in | Wt 139.2 lb

## 2023-02-10 DIAGNOSIS — K5732 Diverticulitis of large intestine without perforation or abscess without bleeding: Secondary | ICD-10-CM

## 2023-02-10 NOTE — Patient Instructions (Signed)
Please call our office when the colonoscopy is scheduled.   Diverticulitis Diverticulitis  La diverticulitis ocurre cuando pequeas bolsas en el colon se infectan o se inflaman. Esto ocasiona dolor en el vientre (abdomen) y heces acuosas (diarrea). Las pequeas bolsas se denominan divertculos. Se pueden formar en las personas que tienen una afeccin llamada diverticulosis. Cules son las causas? Puede desarrollar esta afeccin si queda materia fecal (heces) atrapada en las bolsas del colon. La materia fecal permite el crecimiento de grmenes (bacterias). Esto causa una infeccin. Qu incrementa el riesgo? Es ms probable que contraiga esta afeccin si tiene pequeas bolsas en el colon. Tambin es ms probable que la contraiga si: Tiene sobrepeso o mucho sobrepeso (es obeso). No realiza actividad fsica suficiente. Bebe alcohol. Fuma. Come mucha carne roja, como carne de [place], cerdo o cordero. No consume suficiente fibra. Es mayor de 40 aos. Cules son los signos o sntomas? Dolor en el vientre. El dolor suele aparecer en el lado izquierdo, pero puede manifestarse tambin en otras zonas. Grant Ruts y escalofros. Ganas de vomitar. Vmitos. Clicos. Sensacin de estar lleno. Cambios en la frecuencia de las deposiciones. Sangre en las heces. Cmo se trata? La mayora de los casos se tratan en casa. Es posible que le indiquen lo siguiente: Tomar analgsicos de Sales promotion account executive. Beber solamente lquidos claros. Tomar antibiticos. Descanso. Es posible que los casos muy graves deban tratarse en el hospital. El tratamiento puede incluir: No comer ni beber nada. Tomar analgsicos. Recibir antibiticos por va intravenosa. Recibir lquidos y alimentos a travs de una va intravenosa. Someterse a [place]. Cuando se sienta mejor, el mdico puede indicarle que se someta a una prueba para examinar el colon (colonoscopa). Siga estas instrucciones en su casa: Medicamentos Use los  medicamentos de venta libre y los recetados solamente como se lo haya indicado el mdico. Esto incluye lo siguiente: Pastillas de [place]. Probiticos. Medicamentos para ablandar las heces (laxantes). Si le recetaron antibiticos, tmelos como se lo haya indicado el mdico. No deje de tomarlos aunque comience a sentirse mejor. Pregunte al mdico si debe evitar conducir o Chemical engineer mquinas mientras toma los medicamentos. Comida y bebida  Siga la dieta como se lo haya indicado el mdico. Es posible que solo deba comer y beber lquidos. Cuando se sienta mejor, es posible que pueda comer ms alimentos. Quiz tambin le indiquen que coma mucha fibra. La fibra le ayuda a Advertising copywriter. Los alimentos con fibra incluyen bayas, frijoles, lentejas y verduras verdes. Trate de no comer carne roja. Instrucciones generales No fume ni consuma ningn producto que contenga nicotina o tabaco. Si necesita ayuda para dejar de fumar, consulte al mdico. Haga ejercicio 3 o ms veces por semana. Trate de hacer 30 minutos cada vez. Ejerctese lo suficiente como para transpirar y Environmental education officer los latidos cardacos. Comunquese con un mdico si: El Product/process development scientist. No defeca como lo hace normalmente. Los sntomas no mejoran. Los sntomas empeoran con gran rapidez. Tiene fiebre. Vomita ms de una vez. Sus heces tienen las siguientes caractersticas: Paramedic. Son de color negro. Son alquitranadas. Esta informacin no tiene Theme park manager el consejo del mdico. Asegrese de hacerle al mdico cualquier pregunta que tenga. Document Revised: 01/10/2022 Document Reviewed: 01/10/2022 Elsevier Patient Education  2024 [employer].

## 2023-02-10 NOTE — Progress Notes (Signed)
02/10/2023  History of Present Illness: Michael Mann is a 52 y.o. male presenting for follow up of diverticulitis.  He was last seen on 12/13/22.  He was initially scheduled for a colonoscopy with Dr. Tobi Bastos on 01/24/23, but a repeat CT scan of his abdomen obtained on 01/17/23 showed still some inflammatory changes in the area of the sigmoid colon, although improving, with a possible involvement of the left anterior abdominal wall, tethering of the patient's bladder without evidence of a fistula, and a possible colo-colonic fistula with the sigmoid colon.    Since his last visit, the patient reports that he is not having any pain, nausea, or emesis.  He does still have a persistent lump sensation in the left lower quadrant at the site of the involvement with the anterior abdominal wall.  However, this is not tender unless pushing deeply on it.  Otherwise he feels better, is eating well, with normal bowel function, without any urinary symptoms.  He has an appointment with GI on 02/17/23 to discuss further the timing of his colonoscopy.  Past Medical History: Past Medical History:  Diagnosis Date   Diverticulitis      Past Surgical History: History reviewed. No pertinent surgical history.  Home Medications: Prior to Admission medications   Not on File    Allergies: No Known Allergies  Review of Systems: Review of Systems  Constitutional:  Negative for chills and fever.  Respiratory:  Negative for shortness of breath.   Cardiovascular:  Negative for chest pain.  Gastrointestinal:  Negative for abdominal pain, diarrhea, nausea and vomiting.  Genitourinary:  Negative for dysuria.    Physical Exam BP 105/69 (BP Location: Right Arm, Patient Position: Sitting, Cuff Size: Small)   Pulse 93   Temp 98.1 F (36.7 C) (Oral)   Ht 4\' 11"  (1.499 m)   Wt 139 lb 3.2 oz (63.1 kg)   SpO2 92%   BMI 28.11 kg/m  CONSTITUTIONAL: No acute distress. HEENT:  Normocephalic, atraumatic, extraocular  motion intact. RESPIRATORY:  Normal respiratory effort without pathologic use of accessory muscles. CARDIOVASCULAR: Regular rhythm and rate. GI: The abdomen is soft, non-distended, with localized tenderness in the left lower quadrant at the site of a palpable lump deep to the subcutaneous tissue. There is no hernia associated with this, and no erythema or induration.  NEUROLOGIC:  Motor and sensation is grossly normal.  Cranial nerves are grossly intact. PSYCH:  Alert and oriented to person, place and time. Affect is normal.  Labs/Imaging: CT abdomen/pelvis on 01/17/23: IMPRESSION: 1. Decreased sigmoid colonic wall thickening and pericolonic stranding, compatible with resolved/resolving diverticulitis. 2. Sequela of prior diverticulitis including adhesion of the sigmoid colon with the anterior pelvic wall as well as adhesion of the urinary bladder and sigmoid colon with question of interloop fistula of the sigmoid colon in a stellate area of soft tissue to which the urinary bladder is also adhesed. There is no gas in the urinary bladder to suggest colovesicular fistula however would consider further evaluation with urinalysis. 3. Unchanged subpleural 5 mm left lower lobe pulmonary nodule, likely benign in a low risk patient requiring no additional imaging follow-up.  Assessment and Plan: This is a 52 y.o. male with history of sigmoid diverticulitis.  --Discussed with the patient the rationale behind postponing his colonoscopy, given still some inflammatory changes in his sigmoid colon.  However, clinically he is feeling very well, without any further issues since he completed the antibiotic course prescribed a couple months ago.  It may be  that the inflammatory changes noted on CT scan from 01/17/23 continue to improve or resolve.  He has not had any new symptoms to suggest a true abscess in the abdominal wall or a colovesical fistula. --Discussed with the patient that before any surgery  is done, I would like him to have a colonoscopy to make sure there is no evidence of any malignancy that would change part of our surgical approach.  He has an appointment with GI on 02/17/23 to further discuss. --Patient will follow up with me after his colonoscopy is done so that we can discuss the results and decide the type of surgery that would be most beneficial for him.  He is in agreement with this plan and will keep [place] posted on the timing.  I spent 20 minutes dedicated to the care of this patient on the date of this encounter to include pre-visit review of records, face-to-face time with the patient discussing diagnosis and management, and any post-visit coordination of care.   Howie Ill, MD [facility]

## 2023-02-17 ENCOUNTER — Telehealth: Payer: Self-pay | Admitting: Physician Assistant

## 2023-02-17 ENCOUNTER — Ambulatory Visit: Payer: Self-pay | Admitting: Physician Assistant

## 2023-02-17 NOTE — Telephone Encounter (Signed)
Patient and his daughter called in to reschedule his appointment because they has a family emergency.

## 2023-02-23 NOTE — Progress Notes (Unsigned)
Celso Amy, PA-C [address]  [address]  [place], [place] [address]  Main: [phone]  Fax: [phone]   Primary Care Physician: Pcp, No  Primary Gastroenterologist:  Celso Amy, PA-C / Dr. Wyline Mood    CC: F/U Diverticultis  HPI: Michael Mann is a 52 y.o. male returns for follow-up of sigmoid diverticulitis originally diagnosed 10/24/2022.  He presented to [facility] ED 10/24/2022 with rectal bleeding and LLQ pain.  We are using Spanish interpreter from [facility] language services today.  10/24/22: Abdominal pelvic CT: Focal adhesion of the sigmoid colon to the left bladder dome and left anterior abdominal wall likely sequela of chronic inflammation/diverticulitis.  A fistulous tract extended from sigmoid colon to the left transverse abdominal muscle.  No drainable fluid collection or abscess.  No bowel obstruction.  Treated with Augmentin 875 Mg twice daily for 7 days.  11/15/2022: He followed up with general surgeon Dr. Aleen Campi and was treated with Augmentin 875 twice daily for 14 days.  He has not taken any more antibiotics since then.  12/06/2022: CT abdomen pelvis with contrast: Diverticulosis of the sigmoid colon with mild peridiverticular fat stranding consistent with sigmoid diverticulitis.  No abscess or obstruction.  Mucosal thickening of the sigmoid.  01/17/2023: CT abdomen pelvis with contrast: Decreased sigmoid colon wall thickening and pericolonic stranding, compatible with resolved/resolving diverticulitis.  Sequela of prior diverticulitis including adhesion of the sigmoid colon with the anterior pelvic wall and adhesion of urinary bladder and sigmoid colon.  Question of interloop fistula of sigmoid colon with urinary bladder.  No gas in the urinary bladder.  Current symptoms: Patient states he still has some left lower quadrant abdominal pain.  Otherwise he feels good and is requesting to proceed with colonoscopy.  He denies constipation, diarrhea, or recent rectal  bleeding.  No unintentional weight loss.  No prior colonoscopy.  No family history of colon cancer.     No current outpatient medications on file.   No current facility-administered medications for this visit.    Allergies as of 02/24/2023   (No Known Allergies)    Past Medical History:  Diagnosis Date   Diverticulitis     History reviewed. No pertinent surgical history.  Review of Systems:    All systems reviewed and negative except where noted in HPI.   Physical Examination:   BP 103/67 (BP Location: Left Arm, Patient Position: Sitting, Cuff Size: Normal)   Pulse 89   Temp 98.2 F (36.8 C) (Oral)   Ht 4\' 11"  (1.499 m)   Wt 137 lb 9.6 oz (62.4 kg)   BMI 27.79 kg/m   General: Well-nourished, well-developed in no acute distress.  Lungs: Clear to auscultation bilaterally. Non-labored. Heart: Regular rate and rhythm, no murmurs rubs or gallops.  Abdomen: Bowel sounds are normal; Abdomen is Soft; No hepatosplenomegaly; there is a palpable mass in the LLQ size of an orange which is tender.  Moderate LLQ tenderness.  Rest of abdomen is not tender.  No guarding or rebound tenderness. Neuro: Alert and oriented x 3.  Grossly intact.  Psych: Alert and cooperative, normal mood and affect.   Imaging Studies: No results found.  Assessment and Plan:   Michael Mann is a 52 y.o. 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 -Colonoscopy in  4 weeks (after pelvic MRI). -Continue follow-up with general surgeon Dr. Aleen Campi.   Celso Amy, PA-C  Follow up 4 weeks after Colonoscopy

## 2023-02-24 ENCOUNTER — Encounter: Payer: Self-pay | Admitting: Physician Assistant

## 2023-02-24 ENCOUNTER — Ambulatory Visit: Payer: Self-pay | Admitting: Physician Assistant

## 2023-02-24 ENCOUNTER — Other Ambulatory Visit: Payer: Self-pay

## 2023-02-24 VITALS — BP 103/67 | HR 89 | Temp 98.2°F | Ht 59.0 in | Wt 137.6 lb

## 2023-02-24 DIAGNOSIS — R1904 Left lower quadrant abdominal swelling, mass and lump: Secondary | ICD-10-CM

## 2023-02-24 DIAGNOSIS — K5732 Diverticulitis of large intestine without perforation or abscess without bleeding: Secondary | ICD-10-CM

## 2023-02-24 DIAGNOSIS — R1032 Left lower quadrant pain: Secondary | ICD-10-CM

## 2023-02-24 DIAGNOSIS — R10814 Left lower quadrant abdominal tenderness: Secondary | ICD-10-CM

## 2023-02-24 MED ORDER — GOLYTELY 236 G PO SOLR: 4000.0000 mL | Freq: Once | ORAL | 0 refills | Status: AC

## 2023-03-01 ENCOUNTER — Ambulatory Visit
Admission: RE | Admit: 2023-03-01 | Discharge: 2023-03-01 | Disposition: A | Discharge status: Home / Self Care | Payer: Self-pay | Source: Ambulatory Visit | Attending: Physician Assistant | Admitting: Physician Assistant

## 2023-03-01 DIAGNOSIS — K5732 Diverticulitis of large intestine without perforation or abscess without bleeding: Secondary | ICD-10-CM | POA: Insufficient documentation

## 2023-03-01 DIAGNOSIS — R1904 Left lower quadrant abdominal swelling, mass and lump: Secondary | ICD-10-CM | POA: Insufficient documentation

## 2023-03-01 DIAGNOSIS — R1032 Left lower quadrant pain: Secondary | ICD-10-CM | POA: Insufficient documentation

## 2023-03-01 MED ORDER — GADOBUTROL 1 MMOL/ML IV SOLN
7.0000 mL | Freq: Once | INTRAVENOUS | Status: AC | PRN
  Administered 2023-03-01: 7 mL via INTRAVENOUS

## 2023-03-10 ENCOUNTER — Other Ambulatory Visit: Payer: Self-pay

## 2023-03-10 DIAGNOSIS — K5732 Diverticulitis of large intestine without perforation or abscess without bleeding: Secondary | ICD-10-CM

## 2023-03-10 MED ORDER — NEOMYCIN SULFATE 500 MG PO TABS: ORAL_TABLET | ORAL | 0 refills | Status: DC

## 2023-03-10 MED ORDER — POLYETHYLENE GLYCOL 3350 17 GM/SCOOP PO POWD: ORAL | 0 refills | Status: DC

## 2023-03-10 MED ORDER — METRONIDAZOLE 500 MG PO TABS: ORAL_TABLET | ORAL | 0 refills | Status: DC

## 2023-03-10 MED ORDER — BISACODYL 5 MG PO TBEC: DELAYED_RELEASE_TABLET | ORAL | 0 refills | Status: DC

## 2023-03-11 ENCOUNTER — Telehealth: Payer: Self-pay

## 2023-03-11 NOTE — Telephone Encounter (Signed)
This information was sent to me via Secured chat:  Dr Aleen Campi : mutual patient - can you review MRI we ordered- seems there is a collection and would be a contraindication for colonoscopy at this time- let me know how we both should proceed yiour thoughts   Mon 11:11 AM Celso Amy, PA-C was added by Wyline Mood, MD. Mon 11:11 AM JP Henrene Dodge, MD To me that's a chronic small abscess or fistula track to the abdominal wall. You can feel a small firm area on the abdominal wall on exam. Not specifically worried about it being a contained perforation. Unfortunately it's too small to try to drain. My main worry was to exclude any other issues that may require a larger resection  Mon 11:20 AM Wyline Mood, MD My only concern is when I put her scope in and torque , would the sheer force cause it to tear . Would a barium enema help rule out any space occupying lesion in the area and if needed to an jntra op colonoscopy ?  KA Is this an option ?  Mon 11:22 AM Henrene Dodge, MD yes I think that's ok to do first to try to avoid a colonoscopy.   1  JP His colonoscopy is scheduled for 1/8. do you think it'd be ok to keep that scheduled for now while we get the barium enema study?  Mon 11:23 AM You were added by Wyline Mood, MD. Mon 11:26 AM KA Wyline Mood, MD We can , Lashelle Koy I am on vacation who is doing the procedure  Mon 11:26 AM JP Henrene Dodge, MD It's scheduled under [facility] 11:27 AM KA Wyline Mood, MD [facility] I will have to run it past him tomorrow when he is back to check he is ok , will add him in here tomorrow  1  Mon 11:27 AM JP Henrene Dodge, MD ok thanks! I've asked the office to order the barium enema and contact the patient so he's aware.  1  Mon 11:28 AM JP Midge Minium, MD was added by Wyline Mood, MD. 9:34 AM KA Wyline Mood, MD Darren when you get a minute can you review mri- pt on your schedule for colonoscopy- seen by Inetta Fermo at the office   9:34 AM DW Midge Minium, MD [facility]. If BE negative and patient knows that a perf would require immediate surgery, then I will proceed with the colonoscopy. I will also tell him all of this at the time of the procedure.  1  10:05 AM KA Wyline Mood, MD Inetta Fermo can we have this communicated to the patient please  10:06 AM Celso Amy, PA-C We will notify patient  10:31 AM TG Patient needs Spanish Translator. Kandis Cocking, can you please call pt. and notify above info.  Called patient but had to leave him a  voicemail to call me back.

## 2023-03-12 MED ORDER — CIPROFLOXACIN HCL 500 MG PO TABS: 500.0000 mg | ORAL_TABLET | Freq: Two times a day (BID) | ORAL | 0 refills | Status: DC

## 2023-03-12 MED ORDER — METRONIDAZOLE 500 MG PO TABS: 500.0000 mg | ORAL_TABLET | Freq: Three times a day (TID) | ORAL | 0 refills | Status: DC

## 2023-03-12 NOTE — Progress Notes (Signed)
Kandis Cocking, patient needs Engineer, structural.   1.  Notify patient: Pelvic MRI shows resolving sigmoid diverticulitis.  There is fibrosis/scar tissue.  No evidence of colovesical fistula.  However, there appears to be a small 1.7 x 3.3 cm abscess from the affected sigmoid colon in the left lower abdomen.  Larger in size compared to previous imaging. 2.  **Patient needs to start antibiotics for diverticulitis and small abscess.  Please send Rx ciprofloxacin 500 twice daily x 10 days #20 no refills and metronidazole 500mg , 3 times daily x 10 days #30 no refills.  Do not drink any alcohol while on metronidazole!   3.  Please see telephone encounter with notes from Dr. Tobi Bastos and surgeon Dr. Aleen Campi from 03/11/2023.   4.  Dr. Tobi Bastos and Dr. Servando Snare are reluctant to do a colonoscopy due to increased risk of colon perforation.  Dr. Aleen Campi is ordering a barium enema test. Celso Amy, PA-C

## 2023-03-12 NOTE — Telephone Encounter (Signed)
Patient's daughter called me back and I explained to her about her [facility] Aleen Campi had ordered for him. I also let her know what Mrs. Celso Amy, PA wanted me to let the patient know about his MRI and the fact that she wanted him to start taking antibiotics. Patient's daughter stated that she would make sure that he would get his medications and start taking them. She was also informed that the physicians (Dr. Tobi Bastos and Dr. Servando Snare) agreed on patient getting his colonoscopy done. She understood and had no further questions.

## 2023-03-12 NOTE — Telephone Encounter (Signed)
-----   Message from Celso Amy sent at 03/12/2023  1:54 PM EST ----- Michael Mann, patient needs Spanish translator.   1.  Notify patient: Pelvic MRI shows resolving sigmoid diverticulitis.  There is fibrosis/scar tissue.  No evidence of colovesical fistula.  However, there appears to be a small 1.7 x 3.3 cm abscess from the affected sigmoid colon in the left lower abdomen.  Larger in size compared to previous imaging. 2.  ##Patient needs to start antibiotics for diverticulitis and small abscess.  Please send Rx ciprofloxacin 500 twice daily x 10 days #20 no refills and metronidazole 500mg , 3 times daily x 10 days #30 no refills.  Do not drink any alcohol while on metronidazole!   3.  Please see telephone encounter with notes from Dr. Tobi Bastos and surgeon Dr. Aleen Campi from 03/11/2023.   4.  Dr. Tobi Bastos and Dr. Servando Snare are reluctant to do a colonoscopy due to increased risk of colon perforation.  Dr. Aleen Campi is ordering a barium enema test. Celso Amy, PA-C

## 2023-03-12 NOTE — Telephone Encounter (Signed)
Called patient and left him a detailed message letting him know of the contrast enema study that he has scheduled for this Friday. I also let him know that I needed to talk to him

## 2023-03-14 ENCOUNTER — Ambulatory Visit
Admission: RE | Admit: 2023-03-14 | Discharge: 2023-03-14 | Disposition: A | Discharge status: Home / Self Care | Payer: Self-pay | Source: Ambulatory Visit | Attending: Surgery | Admitting: Surgery

## 2023-03-14 DIAGNOSIS — K5732 Diverticulitis of large intestine without perforation or abscess without bleeding: Secondary | ICD-10-CM | POA: Insufficient documentation

## 2023-03-14 MED ORDER — IOHEXOL 180 MG/ML  SOLN
450.0000 mL | Freq: Once | INTRAMUSCULAR | Status: AC | PRN
  Administered 2023-03-14: 450 mL via ORAL

## 2023-03-31 ENCOUNTER — Telehealth: Payer: Self-pay | Admitting: Gastroenterology

## 2023-03-31 MED ORDER — PEG 3350-KCL-NABCB-NACL-NASULF 236 G PO SOLR: 4000.0000 mL | Freq: Once | ORAL | 0 refills | Status: AC

## 2023-03-31 NOTE — Telephone Encounter (Signed)
 Golytely was sent to [facility] as requested. Notified patient's daughter.

## 2023-03-31 NOTE — Addendum Note (Signed)
 Addended by: Tawnya Crook on: 03/31/2023 01:34 PM   Modules accepted: Orders

## 2023-03-31 NOTE — Telephone Encounter (Signed)
 The patient and his daughter Michael Mann) called in to get his prep resent to the pharmacy. [facility] on [address] [place], [place], [place] [address]. The best number to call is his daughter number [phone].

## 2023-04-02 ENCOUNTER — Ambulatory Visit
Admission: RE | Admit: 2023-04-02 | Discharge: 2023-04-02 | Disposition: A | Discharge status: Home / Self Care | Payer: Self-pay | Attending: Gastroenterology | Admitting: Gastroenterology

## 2023-04-02 ENCOUNTER — Other Ambulatory Visit: Payer: Self-pay

## 2023-04-02 ENCOUNTER — Ambulatory Visit: Payer: Self-pay | Admitting: Anesthesiology

## 2023-04-02 ENCOUNTER — Encounter
Admission: RE | Disposition: A | Discharge status: Home / Self Care | Payer: Self-pay | Source: Home / Self Care | Attending: Gastroenterology

## 2023-04-02 ENCOUNTER — Encounter: Payer: Self-pay | Admitting: Gastroenterology

## 2023-04-02 DIAGNOSIS — R1032 Left lower quadrant pain: Secondary | ICD-10-CM

## 2023-04-02 DIAGNOSIS — R1904 Left lower quadrant abdominal swelling, mass and lump: Secondary | ICD-10-CM

## 2023-04-02 DIAGNOSIS — K5732 Diverticulitis of large intestine without perforation or abscess without bleeding: Secondary | ICD-10-CM

## 2023-04-02 DIAGNOSIS — R933 Abnormal findings on diagnostic imaging of other parts of digestive tract: Secondary | ICD-10-CM | POA: Insufficient documentation

## 2023-04-02 DIAGNOSIS — K56699 Other intestinal obstruction unspecified as to partial versus complete obstruction: Secondary | ICD-10-CM | POA: Insufficient documentation

## 2023-04-02 DIAGNOSIS — F17210 Nicotine dependence, cigarettes, uncomplicated: Secondary | ICD-10-CM | POA: Insufficient documentation

## 2023-04-02 DIAGNOSIS — K56609 Unspecified intestinal obstruction, unspecified as to partial versus complete obstruction: Secondary | ICD-10-CM

## 2023-04-02 DIAGNOSIS — K635 Polyp of colon: Secondary | ICD-10-CM

## 2023-04-02 HISTORY — PX: COLONOSCOPY WITH PROPOFOL: SHX5780

## 2023-04-02 HISTORY — PX: BIOPSY: SHX5522

## 2023-04-02 SURGERY — COLONOSCOPY WITH PROPOFOL
Anesthesia: General

## 2023-04-02 MED ORDER — SODIUM CHLORIDE 0.9 % IV SOLN: INTRAVENOUS | Status: DC

## 2023-04-02 MED ORDER — LIDOCAINE HCL (CARDIAC) PF 100 MG/5ML IV SOSY
PREFILLED_SYRINGE | INTRAVENOUS | Status: DC | PRN
  Administered 2023-04-02: 30 mg via INTRAVENOUS

## 2023-04-02 MED ORDER — PROPOFOL 10 MG/ML IV BOLUS
INTRAVENOUS | Status: DC | PRN
  Administered 2023-04-02: 60 mg via INTRAVENOUS

## 2023-04-02 MED ORDER — PROPOFOL 500 MG/50ML IV EMUL
INTRAVENOUS | Status: DC | PRN
  Administered 2023-04-02: 125 ug/kg/min via INTRAVENOUS

## 2023-04-02 NOTE — H&P (Signed)
   Rogelia Copping, MD [facility] [address] [place], [place] [address] Phone:[phone] Fax : [phone]  Primary Care Physician:  Patient, No Pcp Per Primary Gastroenterologist:  Dr. Copping  Pre-Procedure History & Physical: HPI:  Michael Mann is a 53 y.o. male is here for an colonoscopy.   Past Medical History:  Diagnosis Date   Diverticulitis     History reviewed. No pertinent surgical history.  Prior to Admission medications   Medication Sig Start Date End Date Taking? Authorizing Provider  ciprofloxacin  (CIPRO ) 500 MG tablet Take 1 tablet (500 mg total) by mouth 2 (two) times daily. 03/12/23  Yes [place], PA-C  metroNIDAZOLE  (FLAGYL ) 500 MG tablet Take 1 tablet (500 mg total) by mouth 3 (three) times daily. 03/12/23  Yes [place], PA-C  bisacodyl  (DULCOLAX) 5 MG EC tablet Take all 4 tabs at 8 am the morning prior to surgery. 03/10/23   Desiderio Schanz, MD  polyethylene glycol powder (MIRALAX ) 17 GM/SCOOP powder Mix with 64 ounces of Gatorade (no red) the day prior to surgery and drink as directed. 03/10/23   Desiderio Schanz, MD    Allergies as of 02/24/2023   (No Known Allergies)    History reviewed. No pertinent family history.  Social History   Socioeconomic History   Marital status: Married    Spouse name: Not on file   Number of children: Not on file   Years of education: Not on file   Highest education level: Not on file  Occupational History   Not on file  Tobacco Use   Smoking status: Some Days    Types: Cigarettes   Smokeless tobacco: Never  Substance and Sexual Activity   Alcohol use: Not Currently   Drug use: Never   Sexual activity: Not on file  Other Topics Concern   Not on file  Social History Narrative   Not on file   Social Drivers of Health   Financial Resource Strain: Not on file  Food Insecurity: Not on file  Transportation Needs: Not on file  Physical Activity: Not on file  Stress: Not on file  Social Connections:  Not on file  Intimate Partner Violence: Not on file    Review of Systems: See HPI, otherwise negative ROS  Physical Exam: BP 102/67   Pulse 80   Temp (!) 96.9 F (36.1 C) (Temporal)   Resp 16   Ht 4' 11 (1.499 m)   Wt 59.4 kg   SpO2 100%   BMI 26.46 kg/m  General:   Alert,  pleasant and cooperative in NAD Head:  Normocephalic and atraumatic. Neck:  Supple; no masses or thyromegaly. Lungs:  Clear throughout to auscultation.    Heart:  Regular rate and rhythm. Abdomen:  Soft, nontender and nondistended. Normal bowel sounds, without guarding, and without rebound.   Neurologic:  Alert and  oriented x4;  grossly normal neurologically.  Impression/Plan: Michael Mann is here for an colonoscopy to be performed for abnormal CT scan  Risks, benefits, limitations, and alternatives regarding  colonoscopy have been reviewed with the patient.  Questions have been answered.  All parties agreeable.   Rogelia Copping, MD  04/02/2023, 10:51 AM

## 2023-04-02 NOTE — Op Note (Signed)
 [facility]enter[facility] Patient Name: Michael Mann Procedure Date: 04/02/2023 10:52 AM MRN: [id number] Account #: [id number] Date of Birth: [birth year] Admit Type: Outpatient Age: 53 Room: [facility] ENDO ROOM 4 Gender: Male Note Status: Finalized Instrument Name: Arvis [id number] Procedure:             Colonoscopy Indications:           Abnormal CT of the GI tract Providers:             Rogelia Copping MD, MD Referring MD:          No Local Md, MD (Referring MD) Medicines:             Propofol  per Anesthesia Complications:         No immediate complications. Procedure:             Pre-Anesthesia Assessment:                        - Prior to the procedure, a History and Physical was                         performed, and patient medications and allergies were                         reviewed. The patient's tolerance of previous                         anesthesia was also reviewed. The risks and benefits                         of the procedure and the sedation options and risks                         were discussed with the patient. All questions were                         answered, and informed consent was obtained. Prior                         Anticoagulants: The patient has taken no anticoagulant                         or antiplatelet agents. ASA Grade Assessment: II - A                         patient with mild systemic disease. After reviewing                         the risks and benefits, the patient was deemed in                         satisfactory condition to undergo the procedure.                        After obtaining informed consent, the colonoscope was                         passed under direct vision. Throughout the procedure,  the patient's blood pressure, pulse, and oxygen                         saturations were monitored continuously. The                         Colonoscope was introduced through the anus and                          advanced to the the sigmoid colon. The colonoscopy was                         performed without difficulty. The patient tolerated                         the procedure well. The quality of the bowel                         preparation was excellent. Findings:      The perianal and digital rectal examinations were normal.      A large polyp was found in the sigmoid colon. The polyp was       pedunculated. Biopsies were taken with a cold forceps for histology.      A benign-appearing, intrinsic severe stenosis was found in the sigmoid       colon and was non-traversed. Impression:            - One large polyp in the sigmoid colon. Biopsied.                        - Stricture in the sigmoid colon. Recommendation:        - Discharge patient to home.                        - Resume previous diet.                        - Continue present medications.                        - Return to referring physician. Procedure Code(s):     --- Professional ---                        [phone], 52, Colonoscopy, flexible; with biopsy, single                         or multiple Diagnosis Code(s):     --- Professional ---                        R93.3, Abnormal findings on diagnostic imaging of                         other parts of digestive tract                        D12.5, Benign neoplasm of sigmoid colon CPT copyright 2022 American Medical Association. All rights reserved. The codes documented in this report are preliminary and upon coder review may  be revised to meet current compliance requirements. Tisheena Maguire  Druanne Bosques MD, MD 04/02/2023 11:24:44 AM This report has been signed electronically. Number of Addenda: 0 Note Initiated On: 04/02/2023 10:52 AM Total Procedure Duration: 0 hours 12 minutes 1 second  Estimated Blood Loss:  Estimated blood loss: none.      [facility]

## 2023-04-02 NOTE — Anesthesia Postprocedure Evaluation (Signed)
 Anesthesia Post Note  Patient: Michael Mann  Procedure(s) Performed: COLONOSCOPY WITH PROPOFOL  BIOPSY  Patient location during evaluation: Endoscopy Anesthesia Type: General Level of consciousness: awake and alert Pain management: pain level controlled Vital Signs Assessment: post-procedure vital signs reviewed and stable Respiratory status: spontaneous breathing, nonlabored ventilation, respiratory function stable and patient connected to nasal cannula oxygen Cardiovascular status: blood pressure returned to baseline and stable Postop Assessment: no apparent nausea or vomiting Anesthetic complications: no   No notable events documented.   Last Vitals:  Vitals:   04/02/23 1144 04/02/23 1146  BP: 97/75 106/82  Pulse: 75 71  Resp: 16 (!) 22  Temp:    SpO2: 99% 100%    Last Pain:  Vitals:   04/02/23 1144  TempSrc:   PainSc: 0-No pain                 Fairy POUR Lurena Naeve

## 2023-04-02 NOTE — Transfer of Care (Signed)
 Immediate Anesthesia Transfer of Care Note  Patient: Michael Mann  Procedure(s) Performed: COLONOSCOPY WITH PROPOFOL  BIOPSY  Patient Location: Endoscopy Unit  Anesthesia Type:General  Level of Consciousness: awake, drowsy, and patient cooperative  Airway & Oxygen Therapy: Patient Spontanous Breathing  Post-op Assessment: Report given to RN, Post -op Vital signs reviewed and stable, and Patient moving all extremities X 4  Post vital signs: Reviewed and stable  Last Vitals:  Vitals Value Taken Time  BP 92/69 04/02/23 1124  Temp    Pulse 77 04/02/23 1124  Resp 18 04/02/23 1124  SpO2 98 % 04/02/23 1124  Vitals shown include unfiled device data.  Last Pain:  Vitals:   04/02/23 0956  TempSrc: Temporal         Complications: No notable events documented.

## 2023-04-02 NOTE — Anesthesia Preprocedure Evaluation (Signed)
 Anesthesia Evaluation  Patient identified by MRN, date of birth, ID band Patient awake    Reviewed: Allergy & Precautions, NPO status , Patient's Chart, lab work & pertinent test results  History of Anesthesia Complications Negative for: history of anesthetic complications  Airway Mallampati: III  TM Distance: <3 FB Neck ROM: full    Dental  (+) Chipped, Poor Dentition, Missing   Pulmonary neg shortness of breath, Current Smoker and Patient abstained from smoking.   Pulmonary exam normal        Cardiovascular Exercise Tolerance: Good (-) angina negative cardio ROS Normal cardiovascular exam     Neuro/Psych negative neurological ROS  negative psych ROS   GI/Hepatic negative GI ROS, Neg liver ROS,neg GERD  ,,  Endo/Other  negative endocrine ROS    Renal/GU negative Renal ROS  negative genitourinary   Musculoskeletal   Abdominal   Peds  Hematology negative hematology ROS (+)   Anesthesia Other Findings Past Medical History: No date: Diverticulitis  No past surgical history on file.  BMI    Body Mass Index: 26.46 kg/m      Reproductive/Obstetrics negative OB ROS                             Anesthesia Physical Anesthesia Plan  ASA: 2  Anesthesia Plan: General   Post-op Pain Management:    Induction: Intravenous  PONV Risk Score and Plan: Propofol  infusion and TIVA  Airway Management Planned: Natural Airway and Nasal Cannula  Additional Equipment:   Intra-op Plan:   Post-operative Plan:   Informed Consent: I have reviewed the patients History and Physical, chart, labs and discussed the procedure including the risks, benefits and alternatives for the proposed anesthesia with the patient or authorized representative who has indicated his/her understanding and acceptance.     Dental Advisory Given  Plan Discussed with: Anesthesiologist, CRNA and Surgeon  Anesthesia  Plan Comments: (Patient declines interpreter   Patient consented for risks of anesthesia including but not limited to:  - adverse reactions to medications - risk of airway placement if required - damage to eyes, teeth, lips or other oral mucosa - nerve damage due to positioning  - sore throat or hoarseness - Damage to heart, brain, nerves, lungs, other parts of body or loss of life  Patient voiced understanding and assent.)       Anesthesia Quick Evaluation

## 2023-04-03 ENCOUNTER — Encounter: Payer: Self-pay | Admitting: Gastroenterology

## 2023-04-03 LAB — SURGICAL PATHOLOGY

## 2023-04-07 ENCOUNTER — Encounter: Payer: Self-pay | Admitting: Surgery

## 2023-04-07 ENCOUNTER — Ambulatory Visit (INDEPENDENT_AMBULATORY_CARE_PROVIDER_SITE_OTHER): Payer: Self-pay | Admitting: Surgery

## 2023-04-07 VITALS — BP 101/70 | HR 85 | Temp 98.0°F | Ht 59.0 in | Wt 134.0 lb

## 2023-04-07 DIAGNOSIS — K5732 Diverticulitis of large intestine without perforation or abscess without bleeding: Secondary | ICD-10-CM

## 2023-04-07 MED ORDER — POLYETHYLENE GLYCOL 3350 17 GM/SCOOP PO POWD: ORAL | 0 refills | Status: DC

## 2023-04-07 MED ORDER — METRONIDAZOLE 500 MG PO TABS: ORAL_TABLET | ORAL | 0 refills | Status: DC

## 2023-04-07 MED ORDER — BISACODYL 5 MG PO TBEC: DELAYED_RELEASE_TABLET | ORAL | 0 refills | Status: DC

## 2023-04-07 MED ORDER — NEOMYCIN SULFATE 500 MG PO TABS: ORAL_TABLET | ORAL | 0 refills | Status: DC

## 2023-04-07 NOTE — Progress Notes (Signed)
 04/07/2023  History of Present Illness: Michael Mann is a 53 y.o. male presenting for follow up of diverticulitis.  Patient had an MRI of his pelvis on 03/01/2023 which showed resolving proximal sigmoid diverticulitis with associated fibrosis or scar tissue reaching up to the bladder wall without any evidence of a colovesical fistula as well as a 1.7 x 3.3 outpouching extending from the sigmoid colon to the left lower anterior abdominal wall consistent with a possible contained leak or perforation versus abscess.  He then had a water-soluble contrast enema on 03/14/2023 which again shows the outpouching from the sigmoid colon to the anterior abdominal wall without any evidence of a fistula seen.  He had a colonoscopy with Dr. Jinny on 04/01/2022 which encountered severe stenosis at this area of outpouching and was not traversed.  The patient reports that he has been doing much better and denies any abdominal pain at this point.  He reports feeling better after the last antibiotic course he got.  Still reports the area of swelling in the left lower abdominal wall but denies any significant pain unless with pressing deeply.  He is having normal bowel function and denies any blood in his stool.  Past Medical History: Past Medical History:  Diagnosis Date   Diverticulitis      Past Surgical History: Past Surgical History:  Procedure Laterality Date   BIOPSY  04/02/2023   Procedure: BIOPSY;  Surgeon: Jinny Carmine, MD;  Location: [facility] ENDOSCOPY;  Service: Endoscopy;;   COLONOSCOPY WITH PROPOFOL  N/A 04/02/2023   Procedure: COLONOSCOPY WITH PROPOFOL ;  Surgeon: Jinny Carmine, MD;  Location: [facility] ENDOSCOPY;  Service: Endoscopy;  Laterality: N/A;    Home Medications: Prior to Admission medications   Not on File    Allergies: No Known Allergies  Review of Systems: Review of Systems  Constitutional:  Negative for chills and fever.  Respiratory:  Negative for shortness of breath.   Cardiovascular:   Negative for chest pain.  Gastrointestinal:  Negative for abdominal pain, diarrhea, nausea and vomiting.  Genitourinary:  Negative for dysuria.    Physical Exam BP 101/70   Pulse 85   Temp 98 F (36.7 C)   Ht 4' 11 (1.499 m)   Wt 134 lb (60.8 kg)   SpO2 97%   BMI 27.06 kg/m  CONSTITUTIONAL: No acute distress. HEENT:  Normocephalic, atraumatic, extraocular motion intact. RESPIRATORY:  Normal respiratory effort without pathologic use of accessory muscles. CARDIOVASCULAR: Regular rhythm and rate. GI: The abdomen is soft, non-distended, nontender to palpation.  Area of mild firmness in the left lower quadrant is improving without significant tenderness at this point. NEUROLOGIC:  Motor and sensation is grossly normal.  Cranial nerves are grossly intact. PSYCH:  Alert and oriented to person, place and time. Affect is normal.  Labs/Imaging: CT abdomen/pelvis on 01/17/23: IMPRESSION: 1. Decreased sigmoid colonic wall thickening and pericolonic stranding, compatible with resolved/resolving diverticulitis. 2. Sequela of prior diverticulitis including adhesion of the sigmoid colon with the anterior pelvic wall as well as adhesion of the urinary bladder and sigmoid colon with question of interloop fistula of the sigmoid colon in a stellate area of soft tissue to which the urinary bladder is also adhesed. There is no gas in the urinary bladder to suggest colovesicular fistula however would consider further evaluation with urinalysis. 3. Unchanged subpleural 5 mm left lower lobe pulmonary nodule, likely benign in a low risk patient requiring no additional imaging follow-up.  MRI pelvis on 03/01/2023: IMPRESSION: *Redemonstration of patient's known resolving proximal  sigmoid diverticulitis. There is associated fibrosis/scar tissue reaching up to the right superior urinary bladder wall; however, there is no evidence of air within the urinary bladder and therefore patent colovesical  fistula is excluded. *There is an approximately 1.7 x 3.3 cm outpouching extending inferiorly from the affected sigmoid colon which infiltrates left lower anterior abdominal wall. This finding is present since multiple prior studies but appears gradually enlarging in size and favored to represent a small contained leak/perforation versus sinus tract. *Multiple other nonacute observations, as described above.  Contrast enema study on 03/14/2023: IMPRESSION: Single contrast water soluble barium enema significant for:   Small outpouching in the inferior portion of the sigmoid colon which appears to correlate with MRI findings, is most consistent with a region of contained perforation. No additional contrast extravasation or fistula seen.   Please note a mass at this site can not be excluded by this examination. Correlation with colonoscopy is still required.  Assessment and Plan: This is a 53 y.o. male with history of sigmoid diverticulitis with possible contained perforation versus abscess to the left lower quadrant anterior abdominal wall.  -- Discussed with patient the findings on his most recent imaging studies and colonoscopy.  Overall all this points to an area of diverticulitis that either resulted in a contained perforation or abscess that extends to the left lower quadrant abdominal wall.  There are some tethering of the colon to the bladder but there is no evidence of any colovesical fistula in any of his studies.  The patient also denies any urinary symptoms. - As such, would recommend proceeding with sigmoidectomy to remove this portion of the colon including the perforation/abscess.  Discussed with patient the rationale for doing surgery to prevent this from getting worse or potentially becoming an uncontained perforation.  Also this will allow us  to fully rule out any fistula to the bladder.  The patient is in agreement. - Discussed with him then the plan for robotic assisted  sigmoidectomy and reviewed the surgery at length with him including the planned incisions, risks of bleeding, infection, injury to surrounding structures, that this would be an inpatient surgery, hospital stay, the use of ICG with urology for visualization of the ureters, the possibility of a diverting loop ileostomy, postoperative activity restrictions, pain control, and he is willing to proceed. - We will schedule the patient for surgery on 04/22/2023.  All of his questions have been answered.  I spent 40 minutes dedicated to the care of this patient on the date of this encounter to include pre-visit review of records, face-to-face time with the patient discussing diagnosis and management, and any post-visit coordination of care.    Aloysius Sheree Plant, MD  [facility]

## 2023-04-07 NOTE — Patient Instructions (Addendum)
 We have discussed removing a portion of your damaged colon through 4 small incisions today. We will schedule this surgery at [facility] with Dr. Desiderio. Please plan a hospital stay of 3-7 days for surgery and recovery time.  We will have you complete a bowel prep prior to your surgery. Please see information provided.  You have also been given a (Blue) Pre-Care Sheet with more information regarding your particular surgery. Our surgery scheduler will call you to verify surgery date and to go over information.  Please review all information given.  You will need to arrange to be out of work for approximately 2 weeks and then you may return with a lifting restriction for 4 more weeks. If you have FMLA or Disability paperwork that needs to be filled out, please have your company fax your paperwork to [phone] or you may drop this by either office. This paperwork will be filled out within 3 days after your surgery has been completed.  Please call our office with any questions or concerns prior to your scheduled surgery.  Hoy hemos hablado de la extraccin de una parte de su colon daado a travs de 4 pequeas incisiones. Programaremos esta ciruga en [facility] con el Dr. Desiderio. Planifique una estada en el hospital de 3 a 7 das para la ciruga y el tiempo de customer service manager.  Le pediremos que complete una preparacin intestinal antes de la ciruga. Por favor consulte la informacin proporcionada.  Tambin se le ha entregado una hoja de cuidados previos (azul) con ms informacin sobre su ciruga en particular. Nuestro programador de ciruga lo llamar para verificar la fecha de la ciruga y repasar la informacin.  Por favor revise toda la informacin proporcionada.  Tendr que hacer arreglos para estar sin trabajo durante aproximadamente 2 semanas y luego podr regresar con ignacia restriccin de levantamiento de pesas durante 4 semanas ms. Si tiene [employer] o Discapacidad que debe completarse,  pdale a su empresa que la enve por fax al [phone] o puede entregarla en cualquiera de las oficinas. Esta documentacin se completar dentro de los [address] a que se haya completado su ciruga.  Llame a nuestra oficina si tiene alguna pregunta o inquietud antes de su ciruga programada.  Colectoma parcial mnimamente invasiva en los adultos Minimally Invasive Partial Colectomy, Adult  La colectoma parcial mnimamente invasiva es una ciruga que se realiza para extirpar parte del intestino grueso (colon). La ciruga mnimamente invasiva se realiza a travs de varias incisiones pequeas en el abdomen en lugar de una incisin grande (ciruga abierta). La ciruga mnimamente invasiva tambin se denomina "ciruga laparoscpica". La ciruga mnimamente invasiva puede ser menos dolorosa que la ciruga abierta. Tambin es posible que no sea transport planner hospital durante [place], y el tiempo de recuperacin puede ser ms spx corporation. Este procedimiento se usa  para tratar varias afecciones que implican al colon, incluidas las siguientes: Tumores o masas. Sangrado. Bloqueos u obstrucciones. Enfermedades inflamatorias del intestino, como la enfermedad de Crohn, la colitis ulcerosa o la diverticulitis. Cncer. Informe al mdico acerca de lo siguiente: Cualquier alergia que tenga. Todos los [place] energy usa , incluidos vitaminas, hierbas, gotas oftlmicas, cremas y [address]. Cualquier problema previo que usted o algn miembro de su familia hayan tenido con los anestsicos. Cualquier problema de la sangre que tenga. Cirugas a las que se haya sometido. Cualquier afeccin mdica que tenga. Si est embarazada o podra estarlo. Si tiene antecedentes recientes de lo siguiente: Consumo de tabaco  o nicotina. El consumo de alcohol o drogas. Cules son los riesgos? El science applications international. Pueden incluir: Infeccin. Sangrado. Reacciones  alrgicas a los medicamentos o a los tintes. Daos a las estructuras o los rganos cercanos. Futura obstruccin de los intestinos debido al tejido cicatricial. Probablemente sea necesaria otra ciruga para reparar esto. Prdida de contenido intestinal desde donde se vuelve a unir el colon. La necesidad de cambiar a un procedimiento abierto. Un cogulo de sangre que se forma en la pierna, luego se desprende y se desplaza hasta los pulmones (embolia pulmonar). Qu ocurre antes de la ciruga? Medicamentos Es posible que le pidan que tome un medicamento para vaciar el colon (preparacin del colon). Siga las instrucciones del mdico acerca de cmo hacerlo. No coma ni beba nada ms una vez que haya comenzado con la preparacin del colon, a menos que el mdico le indique que es seguro [place]. Consulte al mdico sobre: Multimedia Programmer o suspender los medicamentos que usa  habitualmente. Estos incluyen cualquier medicamento para la diabetes o anticoagulantes que use. Tomar medicamentos como aspirina o ibuprofeno. Estos medicamentos pueden tener un efecto anticoagulante en la [place]. No los tome, a menos que se lo indique el mdico. Usar medicamentos de venta libre, vitaminas, hierbas y suplementos. Seguridad en la ciruga Pregntele al mdico: Cmo se forensic psychologist de la leisure centre manager. Qu medidas se tomarn para evitar una infeccin. Pueden incluir: Rasurar el vello del lugar de la ciruga. Lavar la piel con un jabn germicida. Tomar antibiticos para eliminar las bacterias del colon. Siga cuidadosamente las indicaciones y tome los medicamentos en los horarios correctos. Instrucciones generales No consuma ningn producto que contenga nicotina ni tabaco durante al lowe's companies las 4 semanas anteriores al procedimiento. Estos productos incluyen cigarrillos, tabaco para mascar y aparatos de vapeo, como los cigarrillos electrnicos. Si necesita ayuda para dejar de fumar, consulte al mdico. Haga que un adulto responsable  lo lleve del hospital a su casa. Qu ocurre durante la ciruga?  Le colocarn una va intravenosa (IV) en una vena. Es posible que le administren lo siguiente: Un sedante. Esto lo ayuda a relajarse. Anestesia. Esto ayudar a: Adormecer ciertas zonas del cuerpo. Hacer que se quede dormido para someterse a la ciruga. Le realizarn varias incisiones pequeas en el abdomen. Le introducirn instrumentos quirrgicos llamados "trocares" a travs de las incisiones pequeas. Le introducirn un tubo metlico fino con ignacia luz y una cmara en el extremo (laparoscopio) en uno de los trocares. La cmara del laparoscopio enviar una imagen a la pantalla de una computadora. Si es necesario, se chemical engineer un robot quirrgico para sales executive laparoscopio y los instrumentos desde una consola de computadora (procedimiento laparoscpico asistido por robot). Las imgenes del laparoscopio se vern en la consola. Le llenarn el abdomen de gas de dixido de carbono. Esto permite que el cirujano vea la zona con mayor claridad y tenga ms espacio para la ciruga. Ignacia parte del colon se extirpar a travs de las incisiones. El tejido sano del colon se volver a unir con puntos (suturas) o con grapas quirrgicas. Las incisiones se cerrarn con suturas, goma para cerrar la piel o tiras adhesivas. Se puede colocar una venda (vendaje) sobre las incisiones. El procedimiento puede variar segn el mdico y el hospital. Michelina sucede despus de la ciruga? Le controlarn la presin arterial, la frecuencia cardaca, la frecuencia respiratoria y el nivel de oxgeno en la sangre hasta que le den el alta del hospital. Ladora administrarn lquidos a travs de un catter  intravenoso, hasta tanto los intestinos comiencen a funcionar correctamente. Le darn medicamentos para human resources officer y las nuseas, si es necesario. Quizs huntsman corporation de compresin. Estas medias ayudan a transport planner formacin de cogulos de [place] y a reducir  la hinchazn de las piernas. Comenzar una dieta de lquidos transparentes. A medida que se recupere, avanzar gradualmente a alimentos blandos. Lo ayudarn a levantarse y caminar lo ms pronto posible. Resumen La colectoma parcial mnimamente invasiva es una ciruga que se realiza para extirpar parte del intestino grueso (colon) debido a la presencia de cncer u otra enfermedad. Es posible que le pidan que tome un medicamento para vaciar el colon (preparacin del colon). Siga las instrucciones del mdico. Le realizarn varias incisiones pequeas en el abdomen. Se introducirn los instrumentos quirrgicos a travs de las incisiones pequeas. Ignacia parte del colon se extirpar a travs de las incisiones. Esta informacin no tiene theme park manager el consejo del mdico. Asegrese de hacerle al mdico cualquier pregunta que tenga. Document Revised: 07/24/2021 Document Reviewed: 07/24/2021 Elsevier Patient Education  2024 [employer].

## 2023-04-08 ENCOUNTER — Telehealth: Payer: Self-pay | Admitting: Surgery

## 2023-04-08 ENCOUNTER — Other Ambulatory Visit: Payer: Self-pay

## 2023-04-08 NOTE — Telephone Encounter (Signed)
 Left message for patient to call, please inform him of the following regarding scheduled surgery with Dr. Desiderio.    Pre-Admission date/time, and Surgery date at [facility].  Surgery Date: 04/22/23 Preadmission Testing Date: 04/15/23 IN PERSON visit 04/15/23 @ 11:00 am [facility]    Also patient will need to call at [phone], between 1-3:00pm the day before surgery, to find out what time to arrive for surgery.

## 2023-04-09 NOTE — Telephone Encounter (Signed)
 Spoke with daughter, Glendon Landsberg and they are aware of all dates regarding surgery.

## 2023-04-11 ENCOUNTER — Encounter: Payer: Self-pay | Admitting: Urgent Care

## 2023-04-15 ENCOUNTER — Encounter
Admission: RE | Admit: 2023-04-15 | Discharge: 2023-04-15 | Disposition: A | Discharge status: Home / Self Care | Payer: Self-pay | Source: Ambulatory Visit | Attending: Surgery | Admitting: Surgery

## 2023-04-15 ENCOUNTER — Other Ambulatory Visit: Payer: Self-pay

## 2023-04-15 ENCOUNTER — Telehealth: Payer: Self-pay

## 2023-04-15 DIAGNOSIS — K5732 Diverticulitis of large intestine without perforation or abscess without bleeding: Secondary | ICD-10-CM

## 2023-04-15 DIAGNOSIS — R7309 Other abnormal glucose: Secondary | ICD-10-CM

## 2023-04-15 DIAGNOSIS — E119 Type 2 diabetes mellitus without complications: Secondary | ICD-10-CM

## 2023-04-15 DIAGNOSIS — Z01812 Encounter for preprocedural laboratory examination: Secondary | ICD-10-CM | POA: Insufficient documentation

## 2023-04-15 HISTORY — DX: Type 2 diabetes mellitus without complications: E11.9

## 2023-04-15 LAB — CBC WITH DIFFERENTIAL/PLATELET
Abs Immature Granulocytes: 0.04 10*3/uL (ref 0.00–0.07)
Basophils Absolute: 0 10*3/uL (ref 0.0–0.1)
Basophils Relative: 0 %
Eosinophils Absolute: 0.1 10*3/uL (ref 0.0–0.5)
Eosinophils Relative: 2 %
HCT: 41 % (ref 39.0–52.0)
Hemoglobin: 14.2 g/dL (ref 13.0–17.0)
Immature Granulocytes: 1 %
Lymphocytes Relative: 26 %
Lymphs Abs: 2.1 10*3/uL (ref 0.7–4.0)
MCH: 29.8 pg (ref 26.0–34.0)
MCHC: 34.6 g/dL (ref 30.0–36.0)
MCV: 86.1 fL (ref 80.0–100.0)
Monocytes Absolute: 0.5 10*3/uL (ref 0.1–1.0)
Monocytes Relative: 6 %
Neutro Abs: 5.3 10*3/uL (ref 1.7–7.7)
Neutrophils Relative %: 65 %
Platelets: 320 10*3/uL (ref 150–400)
RBC: 4.76 MIL/uL (ref 4.22–5.81)
RDW: 12 % (ref 11.5–15.5)
WBC: 8.1 10*3/uL (ref 4.0–10.5)
nRBC: 0 % (ref 0.0–0.2)

## 2023-04-15 LAB — COMPREHENSIVE METABOLIC PANEL
ALT: 24 U/L (ref 0–44)
AST: 14 U/L — ABNORMAL LOW (ref 15–41)
Albumin: 3.9 g/dL (ref 3.5–5.0)
Alkaline Phosphatase: 115 U/L (ref 38–126)
Anion gap: 9 (ref 5–15)
BUN: 16 mg/dL (ref 6–20)
CO2: 25 mmol/L (ref 22–32)
Calcium: 8.8 mg/dL — ABNORMAL LOW (ref 8.9–10.3)
Chloride: 99 mmol/L (ref 98–111)
Creatinine, Ser: 0.67 mg/dL (ref 0.61–1.24)
GFR, Estimated: >60 mL/min (ref >60)
Glucose, Bld: 260 mg/dL — ABNORMAL HIGH (ref 70–99)
Potassium: 3.3 mmol/L — ABNORMAL LOW (ref 3.5–5.1)
Sodium: 133 mmol/L — ABNORMAL LOW (ref 135–145)
Total Bilirubin: 0.6 mg/dL (ref 0.0–1.2)
Total Protein: 7.3 g/dL (ref 6.5–8.1)

## 2023-04-15 LAB — TYPE AND SCREEN
ABO/RH(D): O POS
Antibody Screen: NEGATIVE

## 2023-04-15 LAB — HEMOGLOBIN A1C
Hgb A1c MFr Bld: 12 % — ABNORMAL HIGH (ref 4.8–5.6)
Mean Plasma Glucose: 297.7 mg/dL

## 2023-04-15 MED ORDER — POTASSIUM CHLORIDE CRYS ER 20 MEQ PO TBCR: 20.0000 meq | EXTENDED_RELEASE_TABLET | Freq: Every day | ORAL | 0 refills | Status: DC

## 2023-04-15 NOTE — Patient Instructions (Addendum)
Su procedimiento est programado para: Martes 04/22/2023  Presntese en el mostrador de Chartered certified accountant piso del [employer] Para saber su hora de llegada, llame al [phone] entre la 1:00 p. m. y las 3:00 p. m. en: Lunes 04/21/23 Si su hora de llegada es a las 6:00 am, no llegue antes de esa hora ya que las puertas de [place] [facility] no se abren Teacher, adult education las 6:00 am.  RECORDAR: Las instrucciones que no se siguen completamente pueden provocar riesgos mdicos graves, que pueden llegar hasta la [place]; o, segn el criterio de su cirujano y Scientific laboratory technician, es posible que sea Aeronautical engineer su Leisure centre manager.  No ingiera alimentos despus de la medianoche del da anterior a la ciruga. Siga las indicasiones de su doctor para preparse el dia antes de la [place]. Siga las instrucciones de su cirujano para prepararse el dia antes de la cirujia.  No mascar chicle ni caramelos duros.  Sin embargo, puede beber lquidos [employer] 2 horas antes de la fecha prevista de llegada a la [place]. No beba nada dentro de las 2 horas anteriores a su hora de Nurse, learning disability.  Los lquidos claros incluyen: - agua - jugo de manzana sin pulpa - gatorade (no colores ROJOS) - caf o t negro (NO agregue leche ni cremas al caf o t) NO beba nada que no est en esta lista.   Adems, su mdico le ha recetado que beba lo siguiente: Asegrese de beber una bebida clara con carbohidratos antes de la ciruga gatoradeg2 [employer] esta bebida con carbohidratos [employer]OK horas antes de la ciruga ayuda a reducir la resistencia a la insulina y [employer] de [place]. Termine de beber 2 horas antes de la hora de llegada programada.  Una semana antes de la ciruga: Detenga los antiinflamatorios (AINE) como Advil, Aleve, Ibuprofeno, Motrin, Naproxen, Naprosyn y productos a base de aspirina como Excedrin, Goody's Powder, BC Powder. Suspenda CUALQUIER suplemento de venta libre hasta despus de la  [place]. Sin embargo, puede Educational psychologist tomando Tylenol si es necesario para Marketing executive de la [place]. Contine tomando todos los medicamentos recetados, excepto los siguientes:    Siga las recomendaciones del cardilogo o PCP con respecto a suspender los anticoagulantes.  TOME SLO ESTOS MEDICAMENTOS LA MAANA DE LA CIRUGA CON UN SORBO DE AGUA:  Ninguno    No consumir alcohol durante 24 horas antes o despus de la [place].  No fumar, incluidos los cigarrillos electrnicos, durante las 24 horas previas a la [place]. No consumir productos de tabaco masticables durante al menos 6 horas antes de la [place]. Sin parches de Optometrist de la [place].  No use ningn medicamento "recreativo" durante al menos una semana (preferiblemente 2 semanas) antes de la ciruga. Tenga en cuenta que la combinacin de cocana y anestesia puede [employer], que pueden llegar hasta la [place]. Si su prueba de cocana da positivo, su ciruga ser cancelada.  La maana de la ciruga cepille sus dientes con pasta dental y agua, puede enjuagarse la boca con enjuague bucal si lo desea. No ingiera pasta de dientes ni enjuague bucal.  Utilice jabn o toallitas CHG como se indica en la hoja de instrucciones.  No use joyas, maquillaje, horquillas, clips ni esmalte de uas.  No use lociones, polvos ni perfumes.  No se afeite el vello corporal desde el cuello hacia abajo 48 horas antes de la [place].  No se pueden usar lentes de contacto, audfonos ni dentaduras  postizas durante la [place].  No lleve objetos de valor al hospital. [facility] no es responsable de ninguna pertenencia u objeto de valor perdido o perdido.  Notifique a su mdico si hay algn cambio en su condicin mdica (resfriado, fiebre, infeccin).  Lleve ropa cmoda (especfica para su tipo de [place]) al hospital.  Despus de la ciruga, usted puede ayudar a prevenir complicaciones pulmonares haciendo ejercicios  de respiracin. Respire profundamente y tosa cada 1 o 2 horas. Su mdico puede indicarle un dispositivo llamado espirmetro incentivador para ayudarle a respirar profundamente. Al toser o Engineering geologist, sostenga firmemente una almohada contra la incisin con ambas manos. Esto se llama "ferulizacin". Hacer esto ayuda a proteger su incisin. Tambin disminuye las molestias abdominales.  Si vas a pasar la noche en el hospital, deja tu maleta en el coche. Despus de la [place], es posible que lo lleven a su habitacin.  En caso de un mayor censo de [place], puede ser necesario que usted, el [place], contine con su atencin posoperatoria en el departamento de [place] el [place].  Si le dan el alta el da de la [place], no se le permitir conducir a casa. Necesitar que una persona responsable lo lleve a su casa y se quede con usted durante las 24 horas posteriores a la [place].  Si viaja en transporte pblico, deber ir acompaado de una persona responsable.  Llame al Departamento de pruebas previas a la admisin al [phone] si tiene alguna pregunta sobre estas instrucciones.  Poltica de visitas a ciruga:  [employer] se someten a [place] o procedimiento pueden [employer] familiares o personas de apoyo con ellos, siempre y cuando la persona no sea positiva para COVID-19 ni experimente sus sntomas.  Visitas para pacientes hospitalizados:  El horario de visita es de 7 a 20 horas. Se permiten hasta cuatro visitantes a la vez en la habitacin de un paciente. Los visitantes podrn rotar con Garment/textile technologist. Neomia Dear persona de apoyo designada (adulto) podr pasar la noche.  Debido a un aumento en las tasas de VSR e influenza y las hospitalizaciones asociadas, los nios menores de 12 aos no podrn visitar a los pacientes en los hospitales de [employer]. Se siguen recomendando encarecidamente las mascarillas.  Preparacin para la ciruga con jabn de GLUCONATO DE  CLORHEXIDINA (CHG)  Jabn de gluconato de clorhexidina (CHG)  o Un limpiador antisptico que [employer] grmenes y se adhiere a la piel para seguir [employer] grmenes incluso despus del lavado.  o Se utiliza para ducharse la noche anterior a la [place] y la maana de la [place].  Antes de la [place], usted puede desempear un papel importante al reducir la cantidad de grmenes en su piel. El jabn CHG (gluconato de clorhexidina) es un limpiador antisptico que mata los grmenes y se adhiere a la piel para continuar matndolos incluso despus del lavado.  No lo utilice si es alrgico al CHG o a los jabones antibacterianos. Si su piel se enrojece o irrita, deje de usar CHG.  1. Ducharse la NOCHE ANTES DE LA CIRUGA y la [place] DE LA CIRUGA con jabn CHG.  2. Si eliges lavarte el cabello, lvalo primero como de costumbre con tu champ habitual.  3. Despus del champ, enjuague bien el cabello y el cuerpo para eliminar el champ.  4. Utilice CHG como lo hara con cualquier otro jabn lquido. Puede aplicar CHG directamente sobre la piel y lavar suavemente con un pauelo o una toallita limpia.  5. Aplique el jabn CHG en su cuerpo nicamente desde el cuello hacia abajo. No utilizar en heridas abiertas o llagas abiertas. Evite el contacto con los ojos, odos, boca y genitales (partes privadas). Lvese la cara y los genitales (partes privadas) con su jabn habitual.  6. Lvese bien, prestando especial atencin al rea donde se realizar su ciruga.  7. Enjuague bien su cuerpo con agua tibia.  8. No se duche ni se lave con su jabn normal despus de usar y enjuagar el jabn CHG.  9. Squese dando palmaditas con una toalla limpia.  10. Use pijamas limpios para dormir la noche anterior a la ciruga.  12. Coloque sbanas limpias en su cama la noche de su primera ducha y no duerma con mascotas.  13. Ducharse nuevamente con el jabn CHG el da de la ciruga antes de llegar al hospital.  14.  No aplique desodorantes, lociones o polvos.  15. Por favor use ropa limpia al hospital.

## 2023-04-15 NOTE — Telephone Encounter (Signed)
Spoke with the patient's daughter and she is aware. She will also have patient watch his diet prior to surgery to minimize his sugar intake.

## 2023-04-15 NOTE — Consult Note (Signed)
WOC Nurse requested for preoperative stoma site marking; patient is Spanish speaking.  In person Spanish interpreter utilized for this visit.    Discussed surgical procedure and stoma creation with patient and family.  Explained role of the WOC nurse team.  Provided the patient with educational booklet and provided samples of pouching options.  Answered patient and family questions.   Examined patient sitting and standing in order to place the marking in the patient's visual field, away from any creases or abdominal contour issues and within the rectus muscle.  Attempted to mark below the patient's belt line however patient wears pants very low.     Marked for colostomy in the LLQ 5  cm to the left of the umbilicus and 3 cm below the umbilicus.  Marked for ileostomy in the RLQ  5 cm to the right of the umbilicus and 4 cm below the umbilicus.   Patient's abdomen cleansed with CHG wipes at site markings, allowed to air dry prior to marking.  Covered marks with thin film transparent dressings to preserve marks until date of surgery. Patients surgery is scheduled for 04/22/2023, I did give patient surgical marking pen and additional transparent dressings to remake marks if necessary prior to surgery.    WOC Nurse team will follow up with patient after surgery for continued ostomy care and teaching.   Thank you,    Priscella Mann MSN, RN-BC, [employer] [phone]

## 2023-04-15 NOTE — Telephone Encounter (Signed)
-----   Message from [facility] sent at 04/15/2023  1:52 PM EST ----- Regarding: lab follow up Hi,  This patient is scheduled for surgery with me next Tuesday for diverticulitis.  His K came low at 3.3, and his glucose is elevated too.  Could y'all add Hgb A1c to his previous labs and also send a prescription for potassium chloride 20 mEq daily x 5 days?  If his A1c comes high, may need to delay surgery, but depends how high it is.  Last year his glucose was better.  Thanks!  Jose ----- Message ----- From: Leory Plowman, Lab In [place] Sent: 04/15/2023   1:23 PM EST To: Henrene Dodge, MD

## 2023-04-16 ENCOUNTER — Telehealth: Payer: Self-pay | Admitting: Surgery

## 2023-04-16 DIAGNOSIS — E119 Type 2 diabetes mellitus without complications: Secondary | ICD-10-CM | POA: Insufficient documentation

## 2023-04-16 NOTE — Telephone Encounter (Signed)
-----   Message from [facility] sent at 04/16/2023  8:59 AM EST ----- Regarding: urgent PCP scheduling Good morning,  Thanks for adding the A1c to his labs.  His A1c is 12!  Very uncontrolled diabetes.  Unfortunately with this, we'll have to cancel his surgery.  Otherwise he is at high risk of wound complications and leak from his anastomosis during surgery.   Britta Mccreedy, would you mind contacting him to let him know?  We'll have to cancel surgery for now until his diabetes is better controlled.  I think the phone number listed is for his daughter.  Could you also ask her if he has a PCP?  If he does, he needs to see him/her urgently to help manage his diabetes.  If he does not have a PCP, Caryl-Lyn or Angela, could y'all send an urgent referral to primary care for his uncontrolled diabetes and overall health management?  Let's do a follow up with me in 2 months to see how he's progressing.  Thanks!   Jose ----- Message ----- From: Leory Plowman, Lab In [place] Sent: 04/15/2023   1:23 PM EST To: Henrene Dodge, MD

## 2023-04-16 NOTE — Telephone Encounter (Signed)
Spoke with daughter, Vivien Rota.  She is given information that surgery at this time will need to be cancelled due to her dad's uncontrolled diabetes.  Her dad does not have a primary care doctor, so she is given the number for several primary care clinics to contact.  She is asked to tell them if at all possible to work him in urgently.  It is also recommended that he go to an urgent care clinic, this way he can at least be started on treatment for his diabetes if not able to get in with primary care soon.  A follow up appointment for the patient has been scheduled for late March, will reassess at that time.

## 2023-04-16 NOTE — Progress Notes (Signed)
  Perioperative Services: Pre-Admission/Anesthesia Testing  Abnormal Lab Notification    Date: 04/16/23  Name: Michael Mann MRN:   [id number]  Re: Abnormal labs noted during PAT appointment   Provider(s) Notified: No att. providers found Notification mode: Routed and/or faxed via [facility]   ABNORMAL LAB VALUE(S): Lab Results  Component Value Date   GLUCOSE 260 (H) 04/15/2023    Notes: Patient does not appear to have an official T2DM diagnosis listed in his medical record, therefore he is not currently taking any type of antidiabetic medications. Hgb A1c was added to preoperative labs and found to be elevated at 12.0%, which is consistent with a grossly uncontrolled T2DM diagnosis. Patient does not have a PCP listing in his record at this point either.   Urgency of the planned procedure is not clear. In efforts to reduce the risk of developing SSI, or other potential perioperative complications, this communication is being sent in order to determine if patient is deemed to be safe to proceed with surgery at this time.   In light of the aforementioned A1c, his diabetes could pose increased risks for the patient during their perioperative course and subsequent recovery period. With that being said, the benefit of improving glycemic control must be weighed against the overall risks associated with delaying a necessary elective surgical procedure for this patient.   Quentin Mulling, MSN, APRN, FNP-C, CEN [facility]  Perioperative Services Nurse Practitioner Phone: [phone] Fax: [phone] 04/16/23 7:20 AM

## 2023-04-21 ENCOUNTER — Encounter: Payer: Self-pay | Admitting: Family Medicine

## 2023-04-21 ENCOUNTER — Ambulatory Visit (INDEPENDENT_AMBULATORY_CARE_PROVIDER_SITE_OTHER): Payer: Self-pay | Admitting: Family Medicine

## 2023-04-21 VITALS — BP 113/78 | HR 85 | Temp 97.9°F | Resp 18 | Ht 59.06 in | Wt 138.0 lb

## 2023-04-21 DIAGNOSIS — E1165 Type 2 diabetes mellitus with hyperglycemia: Secondary | ICD-10-CM

## 2023-04-21 DIAGNOSIS — Z7689 Persons encountering health services in other specified circumstances: Secondary | ICD-10-CM

## 2023-04-21 DIAGNOSIS — Z794 Long term (current) use of insulin: Secondary | ICD-10-CM

## 2023-04-21 MED ORDER — BLOOD GLUCOSE MONITORING SUPPL DEVI: 1.0000 | Freq: Three times a day (TID) | 0 refills | Status: AC

## 2023-04-21 MED ORDER — LANCET DEVICE MISC: 1.0000 | Freq: Three times a day (TID) | 0 refills | Status: AC

## 2023-04-21 MED ORDER — LANTUS SOLOSTAR 100 UNIT/ML ~~LOC~~ SOPN: 10.0000 [IU] | PEN_INJECTOR | Freq: Every day | SUBCUTANEOUS | 2 refills | Status: AC

## 2023-04-21 MED ORDER — BLOOD GLUCOSE TEST VI STRP: 1.0000 | ORAL_STRIP | Freq: Three times a day (TID) | 0 refills | Status: AC

## 2023-04-21 MED ORDER — LANCETS MISC. MISC: 1.0000 | Freq: Three times a day (TID) | 0 refills | Status: AC

## 2023-04-21 NOTE — Patient Instructions (Addendum)
LONG ACTING INSULIN INSTRUCTIONS  Check your blood sugar every day. It would be best if you could keep a notebook of dates, blood sugar values, and how much insulin you use. Bring this to your next visit.   If your blood sugar is less than 100, subtract 2 units from the previous day's dose.   If your sugar is between 100-200, you will give yourself the same dose of insulin as the previous day.   If your blood sugar is over 200, you should add 2 units about every 2 days.   - For Example: Yesterday you gave yourself 10 units of Insulin. Today your blood sugar is 235. You should give yourself 12 units today. If two days later, your sugar is 215, you would give yourself 14 units total.    Sugar    Insulin Dosing  0-100    Subtract 2 units from previous days' dose 101-200   Keep the same dose as the previous day 200 or more   Add 2 units to the previous days dose.

## 2023-04-21 NOTE — Progress Notes (Signed)
New Patient Office Visit  Subjective   Patient ID: Michael Mann, male    DOB: [birth year]  Age: 53 y.o. MRN: [id number]  CC:  Chief Complaint  Patient presents with   Diabetes    HPI Michael Mann is a 53 year old male who presents to establish with [facility] at [facility]. Virtual interpreter present for the duration of the visit.   Patient completed CT scan around 12/2022- "which showed resolving proximal sigmoid diverticulitis with associated fibrosis or scar tissue reaching up to the bladder wall without any evidence of a colovesical fistula as well as a 1.7 x 3.3 outpouching extending from the sigmoid colon to the left lower anterior abdominal wall consistent with a possible contained leak or perforation versus abscess." He had MRI pelvis done also 02/2023 and barium contrast enema.   He had an unsuccessful colonoscopy done on 04/02/2023 and was scheduled for sigmoidectomy, possible ostomy. Labs show hypokalemia & hyperglycemia. Hgb A1c added with results of 12%. Patient is here for management of diabetes.   CC: Patient here to establish care  Last PCP: none  Specialists: GI/gen surgery for diverticulitis   PMH: none  Surgical: none   DIABETES: Medication compliance: no medication  Denies chest pain, shortness of breath, vision changes, polydipsia, polyphagia, polyuria, open wounds/ulcers on feet.  Denies hypoglycemia. Reports polyphagia.  "At night, I get up twice during the night to urinate." Reports he drinks coffee and water throughout the day.  Monitoring: blood sugar readings at home: he has not been checking it at home very frequently. When he did check it, it was 400          Well controlled: no  Follow-up: 2 weeks   Lab Results  Component Value Date   HGBA1C 12.0 (H) 04/15/2023    No foot exam found No results found for: "LABMICR", "MICROALBUR"  Wt Readings from Last 3 Encounters:  04/21/23 138 lb (62.6 kg)  04/15/23 134 lb (60.8 kg)   04/07/23 134 lb (60.8 kg)   Outpatient Encounter Medications as of 04/21/2023  Medication Sig   bisacodyl (DULCOLAX) 5 MG EC tablet Take all 4 tablets at 8 am the morning prior to your surgery. (Patient not taking: Reported on 04/21/2023)   ciprofloxacin (CIPRO) 500 MG tablet Take 500 mg by mouth 2 (two) times daily. (Patient not taking: Reported on 04/15/2023)   metroNIDAZOLE (FLAGYL) 500 MG tablet Take 2 tablets at 8AM, take 2 tablets at [facility], and take 2 tablets at 8PM the day prior to your surgery (Patient not taking: Reported on 04/21/2023)   metroNIDAZOLE (FLAGYL) 500 MG tablet Take 500 mg by mouth 3 (three) times daily. (Patient not taking: Reported on 04/21/2023)   neomycin (MYCIFRADIN) 500 MG tablet Take 2 tablet at 8am, take 2 tablets at 2pm, and take 2 tablets at 8pm the day prior to your surgery (Patient not taking: Reported on 04/21/2023)   polyethylene glycol powder (MIRALAX) 17 GM/SCOOP powder Mix full container in 64 ounces of Gatorade or other clear liquid. No Red (Patient not taking: Reported on 04/21/2023)   potassium chloride SA (KLOR-CON M) 20 MEQ tablet Take 1 tablet (20 mEq total) by mouth daily. (Patient not taking: Reported on 04/21/2023)   No facility-administered encounter medications on file as of 04/21/2023.    Patient Active Problem List   Diagnosis Date Noted   Type 2 diabetes mellitus without complications (HCC) 04/16/2023   Diverticulitis of colon 01/14/2023   Past Medical History:  Diagnosis Date  Diverticulitis    T2DM (type 2 diabetes mellitus) (HCC) 04/15/2023   Past Surgical History:  Procedure Laterality Date   BIOPSY  04/02/2023   Procedure: BIOPSY;  Surgeon: Midge Minium, MD;  Location: [facility] ENDOSCOPY;  Service: Endoscopy;;   COLONOSCOPY WITH PROPOFOL N/A 04/02/2023   Procedure: COLONOSCOPY WITH PROPOFOL;  Surgeon: Midge Minium, MD;  Location: [facility] ENDOSCOPY;  Service: Endoscopy;  Laterality: N/A;   History reviewed. No pertinent family history. Social  History   Socioeconomic History   Marital status: Married    Spouse name: Not on file   Number of children: Not on file   Years of education: Not on file   Highest education level: Not on file  Occupational History   Not on file  Tobacco Use   Smoking status: Some Days    Types: Cigarettes    Passive exposure: Past   Smokeless tobacco: Never  Vaping Use   Vaping status: Never Used  Substance and Sexual Activity   Alcohol use: Not Currently   Drug use: Never   Sexual activity: Not on file  Other Topics Concern   Not on file  Social History Narrative   Not on file   Social Drivers of Health   Financial Resource Strain: Not on file  Food Insecurity: Not on file  Transportation Needs: Not on file  Physical Activity: Not on file  Stress: Not on file  Social Connections: Not on file  Intimate Partner Violence: Not on file   Outpatient Medications Prior to Visit  Medication Sig Dispense Refill   bisacodyl (DULCOLAX) 5 MG EC tablet Take all 4 tablets at 8 am the morning prior to your surgery. (Patient not taking: Reported on 04/21/2023) 4 tablet 0   ciprofloxacin (CIPRO) 500 MG tablet Take 500 mg by mouth 2 (two) times daily. (Patient not taking: Reported on 04/15/2023)     metroNIDAZOLE (FLAGYL) 500 MG tablet Take 2 tablets at 8AM, take 2 tablets at [facility], and take 2 tablets at 8PM the day prior to your surgery (Patient not taking: Reported on 04/21/2023) 6 tablet 0   metroNIDAZOLE (FLAGYL) 500 MG tablet Take 500 mg by mouth 3 (three) times daily. (Patient not taking: Reported on 04/21/2023)     neomycin (MYCIFRADIN) 500 MG tablet Take 2 tablet at 8am, take 2 tablets at 2pm, and take 2 tablets at 8pm the day prior to your surgery (Patient not taking: Reported on 04/21/2023) 6 tablet 0   polyethylene glycol powder (MIRALAX) 17 GM/SCOOP powder Mix full container in 64 ounces of Gatorade or other clear liquid. No Red (Patient not taking: Reported on 04/21/2023) 238 g 0   potassium chloride  SA (KLOR-CON M) 20 MEQ tablet Take 1 tablet (20 mEq total) by mouth daily. (Patient not taking: Reported on 04/21/2023) 10 tablet 0   No facility-administered medications prior to visit.   No Known Allergies  ROS: see HPI    Objective   Today's Vitals   04/21/23 1539  BP: 113/78  Pulse: 85  Resp: 18  Temp: 97.9 F (36.6 C)  TempSrc: Oral  SpO2: 96%  Weight: 138 lb (62.6 kg)  Height: 4' 11.06" (1.5 m)  PainSc: 0-No pain    GENERAL: Well-appearing, in NAD. Well nourished.  SKIN: Pink, warm and dry. No rash, lesion, ulceration, or ecchymoses.  Head: Normocephalic. NECK: Trachea midline. Full ROM w/o pain or tenderness. No lymphadenopathy.  EARS: Tympanic membranes are intact, translucent without bulging and without drainage. Appropriate landmarks visualized.  EYES: Conjunctiva  clear without exudates. EOMI, PERRL, no drainage present.  NOSE: Septum midline w/o deformity. Nares patent, mucosa pink and non-inflamed w/o drainage. No sinus tenderness.  THROAT: Uvula midline. Oropharynx clear. Tonsils non-inflamed without exudate. Mucous membranes pink and moist.  RESPIRATORY: Chest wall symmetrical. Respirations even and non-labored. Breath sounds clear to auscultation bilaterally.  CARDIAC: S1, S2 present, regular rate and rhythm without murmur or gallops. Peripheral pulses 2+ bilaterally.  MSK: Muscle tone and strength appropriate for age. Joints w/o tenderness, redness, or swelling.  EXTREMITIES: Without clubbing, cyanosis, or edema.  NEUROLOGIC: No motor or sensory deficits. Steady, even gait. C2-C12 intact.  PSYCH/MENTAL STATUS: Alert, oriented x 3. Cooperative, appropriate mood and affect.       Assessment & Plan:   1. Encounter to establish care (Primary) Patient is a 38- year-old male who presents today to establish care with primary care at [facility]. His daughter is present with him. Reviewed the past medical history, family history, social history, surgical history,  medications and allergies today- updates made as indicated.   2. Type 2 diabetes mellitus with hyperglycemia, with long-term current use of insulin (HCC) Review of chart, hemoglobin A1c 12.0% on 04/15/2023. In-depth discussion about what diabetes is, complications related to elevated blood sugars, and importance of taking insulin as prescribed. Rx sent for Lantus. Discussed with patient starting at 10 units at bedtime and increasing if his fasting blood sugar continues to be elevated. Discussed how/when to administer medication, symptoms of hypoglycemia, when to check blood sugar, desired blood sugar readings, and importance of lifestyle modifications. Nonpharmaological interventions such as focusing on eating a low carb diet, high in vegetables and fruits discussed. Educated on the importance of physical activity. Discussed when to present to the ED. Rx sent for glucose monitor. Patient and daughter verbalized understanding regarding plan of care and all questions answered.  - insulin glargine (LANTUS SOLOSTAR) 100 UNIT/ML Solostar Pen; Inject 10 Units into the skin at bedtime.  Dispense: 3 mL; Refill: 2 - Blood Glucose Monitoring Suppl DEVI; 1 each by Does not apply route in the morning, at noon, and at bedtime. May substitute to any manufacturer covered by patient's insurance.  Dispense: 1 each; Refill: 0 - Glucose Blood (BLOOD GLUCOSE TEST STRIPS) STRP; 1 each by In Vitro route in the morning, at noon, and at bedtime. May substitute to any manufacturer covered by patient's insurance.  Dispense: 100 strip; Refill: 0 - Lancet Device MISC; 1 each by Does not apply route in the morning, at noon, and at bedtime. May substitute to any manufacturer covered by patient's insurance.  Dispense: 1 each; Refill: 0 - Lancets Misc. MISC; 1 each by Does not apply route in the morning, at noon, and at bedtime. May substitute to any manufacturer covered by patient's insurance.  Dispense: 100 each; Refill: 0    Return in  about 2 weeks (around 05/05/2023) for Diabetes f/u.   Alyson Reedy, FNP

## 2023-04-22 ENCOUNTER — Inpatient Hospital Stay: Admit: 2023-04-22 | Payer: Self-pay | Admitting: Surgery

## 2023-04-22 SURGERY — XI ROBOTIC ASSISTED LOWER ANTERIOR RESECTION
Anesthesia: General

## 2023-04-27 NOTE — Progress Notes (Deleted)
 Celso Amy, PA-C [address]  [address]  [place], [place] [address]  Main: [phone]  Fax: [phone]   Primary Care Physician: Alyson Reedy, FNP  Primary Gastroenterologist:  Celso Amy, PA-C / Dr. Midge Minium    CC:  F/U Diverticulitis; Sigmoid Colon Stricture  HPI: Michael Mann is a 53 y.o. male  returns for follow-up of sigmoid diverticulitis originally diagnosed 10/24/2022.  He presented to [facility] ED 10/24/2022 with rectal bleeding and LLQ pain.  We are using Spanish interpreter from [facility] language services today.   10/24/22: Abdominal pelvic CT: Focal adhesion of the sigmoid colon to the left bladder dome and left anterior abdominal wall likely sequela of chronic inflammation/diverticulitis.  A fistulous tract extended from sigmoid colon to the left transverse abdominal muscle.  No drainable fluid collection or abscess.  No bowel obstruction.  Treated with Augmentin 875 Mg twice daily for 7 days.   11/15/2022: He followed up with general surgeon Dr. Aleen Campi and was treated with Augmentin 875 twice daily for 14 days.     12/06/2022: CT abdomen pelvis with contrast: Diverticulosis of the sigmoid colon with mild peridiverticular fat stranding consistent with sigmoid diverticulitis.  No abscess or obstruction.  Mucosal thickening of the sigmoid.   01/17/2023: CT abdomen pelvis with contrast: Decreased sigmoid colon wall thickening and pericolonic stranding, compatible with resolved/resolving diverticulitis.  Sequela of prior diverticulitis including adhesion of the sigmoid colon with the anterior pelvic wall and adhesion of urinary bladder and sigmoid colon.  Question of interloop fistula of sigmoid colon with urinary bladder.  No gas in the urinary bladder.  03/01/2023 pelvic MRI with and without contrast:  *Redemonstration of patient's known resolving proximal sigmoid diverticulitis. There is associated fibrosis/scar tissue reaching up to the right superior urinary  bladder wall; however, there is no evidence of air within the urinary bladder and therefore patent colovesical fistula is excluded. *There is an approximately 1.7 x 3.3 cm outpouching extending inferiorly from the affected sigmoid colon which infiltrates left lower anterior abdominal wall. This finding is present since multiple prior studies but appears gradually enlarging in size and favored to represent a small contained leak/perforation versus sinus tract.  03/14/2023 barium enema:  Small outpouching in the inferior portion of the sigmoid colon which appears to correlate with MRI findings, is most consistent with a region of contained perforation. No additional contrast extravasation or fistula seen.  Colonoscopy 04/02/2023 by Dr. 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  Sigmoid colon biopsies showed colonic mucosa with vascular congestion and hyperplastic epithelial change.  No dysplasia or malignancy.  He was referred back to Dr. Aleen Campi general surgeon.  Patient has not yet had surgery due to uncontrolled diabetes.  He recently establish care with PCP for further treatment of diabetes before he is able to do surgery.  Lab 04/15/23: HgA1C 12.0.  Current symptoms:    Current Outpatient Medications  Medication Sig Dispense Refill   Blood Glucose Monitoring Suppl DEVI 1 each by Does not apply route in the morning, at noon, and at bedtime. May substitute to any manufacturer covered by patient's insurance. 1 each 0   Glucose Blood (BLOOD GLUCOSE TEST STRIPS) STRP 1 each by In Vitro route in the morning, at noon, and at bedtime. May substitute to any manufacturer covered by patient's insurance. 100 strip 0   insulin glargine (LANTUS SOLOSTAR) 100 UNIT/ML Solostar Pen Inject 10 Units into  the skin at bedtime. 3 mL 2   Lancet Device MISC 1 each by Does not apply route in the morning, at noon, and at bedtime. May  substitute to any manufacturer covered by patient's insurance. 1 each 0   Lancets Misc. MISC 1 each by Does not apply route in the morning, at noon, and at bedtime. May substitute to any manufacturer covered by patient's insurance. 100 each 0   No current facility-administered medications for this visit.    Allergies as of 04/28/2023   (No Known Allergies)    Past Medical History:  Diagnosis Date   Diverticulitis    T2DM (type 2 diabetes mellitus) (HCC) 04/15/2023    Past Surgical History:  Procedure Laterality Date   BIOPSY  04/02/2023   Procedure: BIOPSY;  Surgeon: Midge Minium, MD;  Location: [facility] ENDOSCOPY;  Service: Endoscopy;;   COLONOSCOPY WITH PROPOFOL N/A 04/02/2023   Procedure: COLONOSCOPY WITH PROPOFOL;  Surgeon: Midge Minium, MD;  Location: [facility] ENDOSCOPY;  Service: Endoscopy;  Laterality: N/A;    Review of Systems:    All systems reviewed and negative except where noted in HPI.   Physical Examination:   There were no vitals taken for this visit.  General: Well-nourished, well-developed in no acute distress.  Lungs: Clear to auscultation bilaterally. Non-labored. Heart: Regular rate and rhythm, no murmurs rubs or gallops.  Abdomen: Bowel sounds are normal; Abdomen is Soft; No hepatosplenomegaly, masses or hernias;  No Abdominal Tenderness; No guarding or rebound tenderness. Neuro: Alert and oriented x 3.  Grossly intact.  Psych: Alert and cooperative, normal mood and affect.   Imaging Studies: No results found.  Assessment and Plan:   Michael Mann is a 53 y.o. y/o male ***    Celso Amy, PA-C  Follow up ***  BP check ***

## 2023-04-28 ENCOUNTER — Ambulatory Visit: Payer: Self-pay | Admitting: Physician Assistant

## 2023-05-05 ENCOUNTER — Ambulatory Visit: Payer: Self-pay | Admitting: Family Medicine

## 2023-05-12 ENCOUNTER — Ambulatory Visit: Payer: Self-pay | Admitting: Family Medicine

## 2023-06-18 ENCOUNTER — Telehealth: Payer: Self-pay | Admitting: Surgery

## 2023-06-18 ENCOUNTER — Ambulatory Visit: Payer: Self-pay | Admitting: Surgery

## 2023-06-18 NOTE — Telephone Encounter (Signed)
 Patient's daughter, Duwayne Heck called.  She said that her dad did have recent labs done with [facility]. Updated labs to include [facility] was drawn.  She will call them and have them faxed to [place] for Dr. Aleen Campi to review.
